# Patient Record
Sex: Female | Born: 1960 | Race: White | Hispanic: No | Smoking: Former smoker
Health system: Southern US, Community
[De-identification: ages and names within clinical notes are randomized; demographics above are authoritative.]

## PROBLEM LIST (undated history)

## (undated) DIAGNOSIS — F419 Anxiety disorder, unspecified: Secondary | ICD-10-CM

## (undated) DIAGNOSIS — R112 Nausea with vomiting, unspecified: Secondary | ICD-10-CM

## (undated) DIAGNOSIS — Z9889 Other specified postprocedural states: Secondary | ICD-10-CM

## (undated) DIAGNOSIS — F329 Major depressive disorder, single episode, unspecified: Secondary | ICD-10-CM

## (undated) DIAGNOSIS — F32A Depression, unspecified: Secondary | ICD-10-CM

## (undated) HISTORY — PX: OTHER SURGICAL HISTORY: SHX169

---

## 1997-12-17 ENCOUNTER — Encounter: Admission: RE | Admit: 1997-12-17 | Discharge: 1997-12-17 | Payer: Self-pay | Admitting: *Deleted

## 1999-12-14 ENCOUNTER — Other Ambulatory Visit: Admission: RE | Admit: 1999-12-14 | Discharge: 1999-12-14 | Payer: Self-pay | Admitting: Obstetrics and Gynecology

## 2001-01-22 ENCOUNTER — Other Ambulatory Visit: Admission: RE | Admit: 2001-01-22 | Discharge: 2001-01-22 | Payer: Self-pay | Admitting: Obstetrics and Gynecology

## 2002-03-25 ENCOUNTER — Other Ambulatory Visit: Admission: RE | Admit: 2002-03-25 | Discharge: 2002-03-25 | Payer: Self-pay | Admitting: Obstetrics and Gynecology

## 2003-06-24 ENCOUNTER — Other Ambulatory Visit: Admission: RE | Admit: 2003-06-24 | Discharge: 2003-06-24 | Payer: Self-pay | Admitting: Obstetrics and Gynecology

## 2004-06-22 ENCOUNTER — Other Ambulatory Visit: Admission: RE | Admit: 2004-06-22 | Discharge: 2004-06-22 | Payer: Self-pay | Admitting: Obstetrics and Gynecology

## 2004-09-06 ENCOUNTER — Ambulatory Visit (HOSPITAL_COMMUNITY): Admission: RE | Admit: 2004-09-06 | Discharge: 2004-09-06 | Payer: Self-pay | Admitting: Obstetrics and Gynecology

## 2004-09-06 ENCOUNTER — Encounter (INDEPENDENT_AMBULATORY_CARE_PROVIDER_SITE_OTHER): Payer: Self-pay | Admitting: Specialist

## 2006-06-24 ENCOUNTER — Inpatient Hospital Stay (HOSPITAL_COMMUNITY): Admission: RE | Admit: 2006-06-24 | Discharge: 2006-06-25 | Payer: Self-pay | Admitting: Psychiatry

## 2006-06-24 ENCOUNTER — Ambulatory Visit: Payer: Self-pay | Admitting: Psychiatry

## 2008-11-22 ENCOUNTER — Other Ambulatory Visit: Admission: RE | Admit: 2008-11-22 | Discharge: 2008-11-22 | Payer: Self-pay | Admitting: Family Medicine

## 2010-04-10 ENCOUNTER — Emergency Department (HOSPITAL_COMMUNITY)
Admission: EM | Admit: 2010-04-10 | Discharge: 2010-04-10 | Disposition: A | Discharge status: Home / Self Care | Payer: 59 | Attending: Emergency Medicine | Admitting: Emergency Medicine

## 2010-04-10 DIAGNOSIS — T424X4A Poisoning by benzodiazepines, undetermined, initial encounter: Secondary | ICD-10-CM | POA: Insufficient documentation

## 2010-04-10 DIAGNOSIS — Y92009 Unspecified place in unspecified non-institutional (private) residence as the place of occurrence of the external cause: Secondary | ICD-10-CM | POA: Insufficient documentation

## 2010-04-10 DIAGNOSIS — F3289 Other specified depressive episodes: Secondary | ICD-10-CM | POA: Insufficient documentation

## 2010-04-10 DIAGNOSIS — F329 Major depressive disorder, single episode, unspecified: Secondary | ICD-10-CM | POA: Insufficient documentation

## 2010-04-10 DIAGNOSIS — T43502A Poisoning by unspecified antipsychotics and neuroleptics, intentional self-harm, initial encounter: Secondary | ICD-10-CM | POA: Insufficient documentation

## 2010-04-10 LAB — COMPREHENSIVE METABOLIC PANEL
ALT: 14 U/L (ref 0–35)
AST: 16 U/L (ref 0–37)
Albumin: 3.6 g/dL (ref 3.5–5.2)
Alkaline Phosphatase: 44 U/L (ref 39–117)
Calcium: 8.8 mg/dL (ref 8.4–10.5)
GFR calc Af Amer: >60 mL/min (ref >60)
Glucose, Bld: 98 mg/dL (ref 70–99)
Potassium: 3.9 mEq/L (ref 3.5–5.1)
Sodium: 139 mEq/L (ref 135–145)
Total Protein: 5.9 g/dL — ABNORMAL LOW (ref 6.0–8.3)

## 2010-04-10 LAB — URINALYSIS, ROUTINE W REFLEX MICROSCOPIC
Ketones, ur: NEGATIVE mg/dL
Specific Gravity, Urine: 1.02 (ref 1.005–1.030)
Urine Glucose, Fasting: NEGATIVE mg/dL
pH: 5.5 (ref 5.0–8.0)

## 2010-04-10 LAB — RAPID URINE DRUG SCREEN, HOSP PERFORMED
Amphetamines: NOT DETECTED
Barbiturates: NOT DETECTED
Cocaine: NOT DETECTED
Opiates: NOT DETECTED
Tetrahydrocannabinol: NOT DETECTED

## 2010-04-10 LAB — CBC
Hemoglobin: 13 g/dL (ref 12.0–15.0)
MCH: 30.5 pg (ref 26.0–34.0)
RBC: 4.26 MIL/uL (ref 3.87–5.11)
WBC: 8.7 10*3/uL (ref 4.0–10.5)

## 2010-04-10 LAB — DIFFERENTIAL
Basophils Relative: 0 % (ref 0–1)
Lymphs Abs: 3.2 10*3/uL (ref 0.7–4.0)
Monocytes Relative: 8 % (ref 3–12)
Neutro Abs: 4.5 10*3/uL (ref 1.7–7.7)
Neutrophils Relative %: 52 % (ref 43–77)

## 2010-04-10 LAB — ACETAMINOPHEN LEVEL: Acetaminophen (Tylenol), Serum: <10 ug/mL — ABNORMAL LOW (ref 10–30)

## 2010-04-13 ENCOUNTER — Other Ambulatory Visit (HOSPITAL_COMMUNITY): Payer: 59 | Attending: Psychiatry | Admitting: Psychiatry

## 2010-04-13 DIAGNOSIS — F331 Major depressive disorder, recurrent, moderate: Secondary | ICD-10-CM

## 2010-04-13 DIAGNOSIS — F1110 Opioid abuse, uncomplicated: Secondary | ICD-10-CM | POA: Insufficient documentation

## 2010-04-13 DIAGNOSIS — Z6379 Other stressful life events affecting family and household: Secondary | ICD-10-CM | POA: Insufficient documentation

## 2010-04-13 DIAGNOSIS — F339 Major depressive disorder, recurrent, unspecified: Secondary | ICD-10-CM | POA: Insufficient documentation

## 2010-04-13 DIAGNOSIS — IMO0002 Reserved for concepts with insufficient information to code with codable children: Secondary | ICD-10-CM | POA: Insufficient documentation

## 2010-04-13 DIAGNOSIS — Z818 Family history of other mental and behavioral disorders: Secondary | ICD-10-CM | POA: Insufficient documentation

## 2010-04-13 DIAGNOSIS — F411 Generalized anxiety disorder: Secondary | ICD-10-CM

## 2010-04-14 ENCOUNTER — Other Ambulatory Visit (HOSPITAL_COMMUNITY): Payer: 59 | Attending: Psychiatry | Admitting: Psychiatry

## 2010-04-14 DIAGNOSIS — IMO0002 Reserved for concepts with insufficient information to code with codable children: Secondary | ICD-10-CM | POA: Insufficient documentation

## 2010-04-14 DIAGNOSIS — F339 Major depressive disorder, recurrent, unspecified: Secondary | ICD-10-CM | POA: Insufficient documentation

## 2010-04-14 DIAGNOSIS — F411 Generalized anxiety disorder: Secondary | ICD-10-CM | POA: Insufficient documentation

## 2010-04-14 DIAGNOSIS — Z560 Unemployment, unspecified: Secondary | ICD-10-CM | POA: Insufficient documentation

## 2010-04-14 DIAGNOSIS — F1110 Opioid abuse, uncomplicated: Secondary | ICD-10-CM | POA: Insufficient documentation

## 2010-04-14 DIAGNOSIS — F17200 Nicotine dependence, unspecified, uncomplicated: Secondary | ICD-10-CM | POA: Insufficient documentation

## 2010-04-17 ENCOUNTER — Other Ambulatory Visit (HOSPITAL_COMMUNITY): Payer: 59 | Admitting: Psychiatry

## 2010-04-18 ENCOUNTER — Other Ambulatory Visit (HOSPITAL_COMMUNITY): Payer: 59 | Admitting: Psychiatry

## 2010-04-19 ENCOUNTER — Other Ambulatory Visit (HOSPITAL_COMMUNITY): Payer: 59 | Admitting: Psychiatry

## 2010-04-20 ENCOUNTER — Other Ambulatory Visit (HOSPITAL_COMMUNITY): Payer: 59 | Admitting: Psychiatry

## 2010-04-21 ENCOUNTER — Other Ambulatory Visit (HOSPITAL_COMMUNITY): Payer: 59 | Admitting: Psychiatry

## 2010-04-24 ENCOUNTER — Other Ambulatory Visit (HOSPITAL_COMMUNITY): Payer: 59 | Admitting: Psychiatry

## 2010-04-25 ENCOUNTER — Other Ambulatory Visit (HOSPITAL_COMMUNITY): Payer: 59 | Admitting: Psychiatry

## 2010-04-26 ENCOUNTER — Other Ambulatory Visit (HOSPITAL_COMMUNITY): Payer: 59 | Admitting: Psychiatry

## 2010-04-27 ENCOUNTER — Other Ambulatory Visit (HOSPITAL_COMMUNITY): Payer: 59 | Admitting: Psychiatry

## 2010-04-28 ENCOUNTER — Other Ambulatory Visit (HOSPITAL_COMMUNITY): Payer: 59 | Admitting: Psychiatry

## 2010-05-01 ENCOUNTER — Other Ambulatory Visit (HOSPITAL_COMMUNITY): Payer: 59 | Admitting: Psychiatry

## 2010-05-02 ENCOUNTER — Other Ambulatory Visit (HOSPITAL_COMMUNITY): Payer: 59 | Admitting: Psychiatry

## 2010-05-03 ENCOUNTER — Other Ambulatory Visit (HOSPITAL_COMMUNITY): Payer: 59 | Admitting: Psychiatry

## 2010-05-04 ENCOUNTER — Other Ambulatory Visit (HOSPITAL_COMMUNITY): Payer: 59 | Admitting: Psychiatry

## 2010-05-05 ENCOUNTER — Other Ambulatory Visit (HOSPITAL_COMMUNITY): Payer: 59 | Admitting: Psychiatry
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
## 2010-06-30 NOTE — H&P (Signed)
NAME:  Deanna Charles, Deanna Charles                 ACCOUNT NO.:  [id number]   MEDICAL RECORD NO.:  [id number]          PATIENT TYPE:  AMB   LOCATION:  SDC                           FACILITY:  [facility]   PHYSICIAN:  Charles A. Delcambre, MDDATE OF BIRTH:  Oct 20, 1960   DATE OF ADMISSION:  DATE OF DISCHARGE:                                HISTORY & PHYSICAL   CHIEF COMPLAINT:  Menorrhagia and endometrial polyp.   HISTORY OF PRESENT ILLNESS:  A 50 year old para 3-0-0-3 with abnormal  bleeding, heavy bleeding and known endometrial polyps on sonohysterogram,  multiple, failing Megace therapy and some small fibroids.  Endometrial  biopsy benign.   PAST MEDICAL HISTORY:  Anxiety and depression.   PAST SURGICAL HISTORY:  1.  Tubal ligation.  2.  SVD x3.   ALLERGIES:  No known drug allergies.   SOCIAL HISTORY:  One pack a day of cigarette smoking.  Occasional glass of  wine.  No drug use or STD exposure in the past.  Patient married, in  monogamous relationship with her husband.   FAMILY HISTORY:  Father with hypertension, Parkinson's disease, otherwise  negative family history by template.   REVIEW OF SYSTEMS:  Denies fever, chills, rashes, wheezing, headaches,  dizziness, seasonal allergies, chest pain, shortness of breath, diarrhea,  constipation, bleeding per rectum, urgency, frequency, dysuria,  galactorrhea, emotional changes.   PHYSICAL EXAMINATION:  GENERAL:  Alert and oriented x3.  No distress.  VITAL SIGNS:  Blood pressure 90/58, heart rate 72, weight 155 pounds,  respirations 18.  HEENT:  Grossly within normal limits.  NECK:  Supple without thyromegaly or adenopathy.  LUNGS:  Clear bilaterally.  HEART:  Regular rate and rhythm.  BREASTS:  Symmetrical, otherwise not examined.  ABDOMEN:  Soft, flat, and nontender.  No hepatosplenomegaly or other masses  noted.  PELVIC:  Normal external female genitalia.  Bartholin's, urethral, Skene's  within normal limits.  Vault without discharge or  lesions.  Multiparous  cervix noted.  Bimanual examination:  Uterus not significantly enlarged.  Uterus 8-10 weeks size.  I could not detect any significant enlargement  directly or irregularity.  Uterus was retroflexed.  Adnexa nontender without  masses bilaterally.  Ovaries palpably normal size bilaterally.   ASSESSMENT:  1.  Menorrhagia with prolonged irregular bleeding.  2.  Endometrial polyps.   PLAN:  Hysteroscopy, D&C.  Patient gives informed consent.  Accepts risk of  infection, bleeding, uterine perforation, blood product risks including  hepatitis and HIV exposure.  All questions were answered.  She understands  risks that this will not correct her bleeding or control her bleeding,  possibility of having to go on to ablation versus hysterectomy at another  presentation.  All questions were answered and she will remain n.p.o. past  midnight evening prior to surgery.  We will proceed on with preoperative  chem-20, CBC.       CAD/MEDQ  D:  08/29/2004  T:  08/29/2004  Job:  [id number]

## 2010-06-30 NOTE — Op Note (Signed)
NAME:  Deanna Charles, Deanna Charles                 ACCOUNT NO.:  [id number]   MEDICAL RECORD NO.:  [id number]          PATIENT TYPE:  AMB   LOCATION:  SDC                           [facility]ILITY[facility]   PHYSICIAN:  Charles A. Delcambre, MDDATE OF BIRTH:  January 30, 1961   DATE OF PROCEDURE:  09/06/2004  DATE OF DISCHARGE:                                 OPERATIVE REPORT   PREOPERATIVE DIAGNOSES:  1.  Menorrhagia.  2.  Endometrial polyps.   POSTOPERATIVE DIAGNOSES:  1.  Menorrhagia.  2.  Endometrial polyps.   PROCEDURES:  1.  Hysteroscopy.  2.  Dilation and curettage.  3.  Polypectomy.  4.  Paracervical block.   SURGEON:  Charles A. Sydnee Cabal, M.D.   ASSISTANT:  None.   COMPLICATIONS:  None.   OPERATIVE FINDINGS:  Endometrial polyps, three in the fundal area and one in  the lower uterine segment area.  Sound to 9 cm.  Uterine distention medium  loss sorbitol 20 mL.   SPECIMENS:  Endometrial polyps and endometrial curettings.   ESTIMATED BLOOD LOSS:  Less than or equal to 25 mL.   Sponge and needle count correct and instrument count correct.   DESCRIPTION OF PROCEDURE:  The patient was taken to the operating room and  placed in the dorsal lithotomy position and sterile prep and drape.  MAC  sedation was undertaken.  A weighted speculum was placed in the vagina.  A  single-tooth tenaculum was placed on the cervix.  A paracervical block of  0.25% plain Marcaine was placed, 20 mL divided equally at 4 and 8 o'clock  and no evidence of intravascular injection was noted.  Hanks dilators were  used to dilate enough to pass a 5 mm hysteroscope.  Hysteroscopy findings  were noted above.  Polyp forceps were used to remove the polyps directly.  There was no evidence of perforation during the procedure, and a small banjo  curette was used to curette the endometrial surface circumferentially.  No  complications were encountered.  No  evidence of perforation was encountered.  Hemostasis was adequate.  The  patient was awakened, all instruments were removed.  Hemostasis was adequate  at the tenaculum site and from the uterus upon visualization.  The patient  was taken to recovery room with physician in attendance, having tolerated  the procedure well.       CAD/MEDQ  D:  09/06/2004  T:  09/06/2004  Job:  [id number]

## 2011-03-29 ENCOUNTER — Emergency Department (HOSPITAL_COMMUNITY)
Admission: EM | Admit: 2011-03-29 | Discharge: 2011-03-29 | Disposition: A | Discharge status: Home / Self Care | Payer: 59 | Attending: Emergency Medicine | Admitting: Emergency Medicine

## 2011-03-29 ENCOUNTER — Encounter (HOSPITAL_COMMUNITY): Payer: Self-pay | Admitting: *Deleted

## 2011-03-29 DIAGNOSIS — S61209A Unspecified open wound of unspecified finger without damage to nail, initial encounter: Secondary | ICD-10-CM | POA: Insufficient documentation

## 2011-03-29 DIAGNOSIS — IMO0001 Reserved for inherently not codable concepts without codable children: Secondary | ICD-10-CM | POA: Insufficient documentation

## 2011-03-29 DIAGNOSIS — T148XXA Other injury of unspecified body region, initial encounter: Secondary | ICD-10-CM

## 2011-03-29 DIAGNOSIS — Z23 Encounter for immunization: Secondary | ICD-10-CM | POA: Insufficient documentation

## 2011-03-29 MED ORDER — RABIES VACCINE, PCEC IM SUSR
1.0000 mL | Freq: Once | INTRAMUSCULAR | Status: AC
  Administered 2011-03-29: 1 mL via INTRAMUSCULAR
  Filled 2011-03-29: qty 1

## 2011-03-29 MED ORDER — RABIES IMMUNE GLOBULIN 150 UNIT/ML IM INJ
20.0000 [IU]/kg | INJECTION | Freq: Once | INTRAMUSCULAR | Status: AC
  Administered 2011-03-29: 1725 [IU] via INTRAMUSCULAR
  Filled 2011-03-29: qty 11.5

## 2011-03-29 MED ORDER — RABIES IMMUNE GLOBULIN 150 UNIT/ML IM INJ
INJECTION | INTRAMUSCULAR | Status: AC
  Filled 2011-03-29: qty 2

## 2011-03-29 NOTE — Discharge Instructions (Signed)
Please come to the [facility] on day 3 -Feb 17, day 7 - Feb 21, day 14- Feb 28, and Day 28 -  March 14. For your  Rabies Vacine shots.Please finish your antibiotics for your hand as scheduled.Rabies Vaccine What You Need to Know WHAT IS RABIES?  Rabies is a serious disease. It is caused by a virus.   Rabies is mainly a disease of animals. Humans get rabies when they are bitten by infected animals.   At first there might not be any symptoms. Weeks, or even years after a bite, rabies can cause pain, fatigue, headaches, fever, and irritability. These are followed by seizures, hallucinations, and paralysis. Rabies is almost always fatal.   Wild animals, especially bats, are common source of human rabies infection. Skunks, raccoons, dogs, cats, coyotes, foxes and other mammals can also transmit the disease.   Human rabies is rare in the [place]. There have been only 55 cases diagnosed since 1990. However, between 16,000 and 39,000 people are vaccinated each year as a precaution after animal bites. Also, rabies is far more common in other parts of the world, with about 40,000 to 70,000 rabies-related deaths worldwide each year. Bites from unvaccinated dogs cause most of these cases.  Rabies vaccine can prevent rabies. RABIES VACCINE  Rabies vaccine is given to people at high risk of rabies to protect them if they are exposed. It can also prevent the disease if it is given to a person after they have been exposed.   Rabies vaccine is made from killed rabies virus. It cannot cause rabies.  WHO SHOULD GET RABIES VACCINE AND WHEN? Preventive Vaccination (No Exposure)  People at high risk of exposure to rabies, such as veterinarians, Educational psychologist, rabies laboratory workers, spelunkers, and rabies biologics production workers should be offered rabies vaccine.   The vaccine should also be considered for:   People whose activities bring them into frequent contact with rabies virus or with  possibly rabid animals.   International travelers who are likely to come in contact with animals in parts of the world where rabies is common.   The pre-exposure schedule for rabies vaccination is 3 doses, given at the following times:   Dose 1: As appropriate.   Dose 2: 7 days after Dose 1.   Dose 3: 21 days or 28 days after Dose 1.   For laboratory workers and others who may be repeatedly exposed to rabies virus, periodic testing for immunity is recommended. Booster doses should be given as needed. (Testing or booster doses are not recommended for travelers). Ask your caregiver for details.  Vaccination After an Exposure Anyone who has been bitten by an animal, or who otherwise may have been exposed to rabies, should see a caregiver immediately. The caregiver will determine if he or she needs to be vaccinated.  A person who is exposed and has never been vaccinated against rabies should get 4 doses of rabies vaccine.One dose right away and additional doses on the 3rd, 7th, and 14th days. They should also get another shot called RabiesImmuneGlobulin at the same time as the first dose.   A person who has been previously vaccinated should get 2 doses of rabies vaccine. One right away and another on the 3rd day. Rabies Immune Globulin is not needed.  TELL YOUR CAREGIVER IF: Talk with a caregiver before getting rabies vaccine if you:  Ever had a serious (life-threatening) allergic reaction to a previous dose of rabies vaccine or to any component  of the vaccine.   Have any severe allergies.   Have a weakened immune system because of:   HIV, AIDS, or another disease that affects the immune system.   Treatment with drugs that affect the immune system, such as steroids.   Cancer or cancer treatment with radiation or drugs.  If you have a minor illnesses, such as a cold, you can be vaccinated. If you are moderately or severely ill, wait until you recover before getting a routine  (non-exposure) dose of rabies vaccine. If you have been exposed to rabies virus, you should get the vaccine regardless of any other illnesses you may have. WHAT ARE THE RISKS FROM RABIES VACCINE? A vaccine, like any medicine, is capable of causing serious problems, such as severe allergic reactions. The risk of a vaccine causing serious harm, or death, is extremely small. Serious problems from rabies vaccine are very rare.  Mild problems:  Soreness, redness, swelling, or itching where the shot was given (30 % to 74 %).   Headache, nausea, abdominal pain, muscle aches, or dizziness (5% to 40 %).  Moderate problems:  Hives, pain in the joints, or fever (about 6 % of booster doses).   Other nervous system disorders, such as Guillain-Barr Syndrome (GBS), have been reported after rabies vaccine. This happens so rarely that it is not known whether they are related to the vaccine.  Note: Several brands of rabies vaccine are available in the [place], and reactions may vary between brands. Your caregiver can give you more information about a particular brand. WHAT IF THERE IS A MODERATE OR SEVERE REACTION? What should I look for? Any unusual condition, such as a severe allergic reaction or a high fever. If a severe allergic reaction occurred, it would be within a few minutes to an hour after the shot. Signs of a serious allergic reaction can include difficulty breathing, weakness, hoarseness or wheezing, a fast heartbeat, hives, dizziness, paleness, or swelling of the throat. What should I do?  Call your caregiver or get the person to a caregiver right away.   Tell the caregiver what happened, the date and time it happened, and when the vaccination was given.   Ask the caregiver, nurse, or health department to report the reaction by filing a Vaccine Adverse Event Reporting System (VAERS) form. Or, you can file this report through the VAERS website at www.vaers.[place].no or by calling  1-(903) 062-4559.  VAERS does not provide medical advice. HOW CAN I LEARN MORE?  Ask your caregiver or other health care provider. He or she can give you the vaccine package insert or suggest other sources of information.   Call your local or state health department.   Contact the Centers for Disease Control and Prevention (CDC):   Visit the CDC rabies website at [place]  CDC Rabies Vaccine VIS (11/18/07) Document Released: 11/26/2005 Document Revised: 10/17/2010 Document Reviewed: 05/06/2008 [facility] Patient Information 2012 [place], [place].

## 2011-03-29 NOTE — ED Notes (Signed)
Pt DC to home in the care of family 

## 2011-03-29 NOTE — ED Notes (Signed)
Told by PCP to come to ER for rabies vaccine.

## 2011-03-29 NOTE — ED Provider Notes (Signed)
History     CSN: [id number]  Arrival date & time 03/29/11  1907   None     Chief Complaint  Patient presents with  . Animal Bite    (Consider location/radiation/quality/duration/timing/severity/associated sxs/prior treatment) HPI Comments: Pt sustained a bite by a stray cat to the right index finger. Pt has been treated with antibiotic injections and oral antibiotics by Urgent Care. The cat could not be found for observation. Pt presents for rabies vaccine.  The history is provided by the patient.    History reviewed. No pertinent past medical history.  Past Surgical History  Procedure Date  . Cesarean section     History reviewed. No pertinent family history.  History  Substance Use Topics  . Smoking status: Never Smoker   . Smokeless tobacco: Not on file  . Alcohol Use: No    OB History    Grav Para Term Preterm Abortions TAB SAB Ect Mult Living                  Review of Systems  Constitutional: Negative for activity change.       All ROS Neg except as noted in HPI  HENT: Negative for nosebleeds and neck pain.   Eyes: Negative for photophobia and discharge.  Respiratory: Negative for cough, shortness of breath and wheezing.   Cardiovascular: Negative for chest pain and palpitations.  Gastrointestinal: Negative for abdominal pain and blood in stool.  Genitourinary: Negative for dysuria, frequency and hematuria.  Musculoskeletal: Negative for back pain and arthralgias.       Animal bite to the hand  Skin: Negative.   Neurological: Negative for dizziness, seizures and speech difficulty.  Psychiatric/Behavioral: Negative for hallucinations and confusion.    Allergies  Review of patient's allergies indicates no known allergies.  Home Medications   Current Outpatient Rx  Name Route Sig Dispense Refill  . CITALOPRAM HYDROBROMIDE 20 MG PO TABS Oral Take 20 mg by mouth daily.    Marland Kitchen CLONAZEPAM 0.5 MG PO TABS Oral Take 0.5 mg by mouth 4 (four) times daily as  needed. For anxiety      BP 102/67  Pulse 65  Temp(Src) 97.9 F (36.6 C) (Oral)  Resp 20  Ht 5\' 11"  (1.803 m)  Wt 189 lb (85.73 kg)  BMI 26.36 kg/m2  SpO2 100%  Physical Exam  Nursing note and vitals reviewed. Constitutional: She is oriented to person, place, and time. She appears well-developed and well-nourished.  Non-toxic appearance.  HENT:  Head: Normocephalic.  Right Ear: Tympanic membrane and external ear normal.  Left Ear: Tympanic membrane and external ear normal.  Eyes: EOM and lids are normal. Pupils are equal, round, and reactive to light.  Neck: Normal range of motion. Neck supple. Carotid bruit is not present.  Cardiovascular: Normal rate, regular rhythm, normal heart sounds, intact distal pulses and normal pulses.   Pulmonary/Chest: Breath sounds normal. No respiratory distress.  Abdominal: Soft. Bowel sounds are normal. There is no tenderness. There is no guarding.  Musculoskeletal: Normal range of motion.       There are healing puncture wounds of the palmar surface and the dorsum of the right second finger. There continues to be mild to moderate swelling of the DIP joint of the second finger. Sensory is intact. There is good capillary refill. There is no red streaking going up the hand or arm. The finger is not hot.  Lymphadenopathy:       Head (right side): No submandibular adenopathy present.  Head (left side): No submandibular adenopathy present.    She has no cervical adenopathy.  Neurological: She is alert and oriented to person, place, and time. She has normal strength. No cranial nerve deficit or sensory deficit.  Skin: Skin is warm and dry.  Psychiatric: She has a normal mood and affect. Her speech is normal.    ED Course  Procedures (including critical care time)  Labs Reviewed - No data to display No results found.   Dx: 1. Cat bite to the right index finger. 2. Possible rabies exposure.   MDM  I have reviewed nursing notes, vital signs,  and all appropriate lab and imaging results for this patient. Rabies vaccine and rabies immune globulin given given in the emergency department. Arrangements have been made for the patient to have the remainder of rabies vaccine at the specialty clinic.       Kathie Dike, [place] 03/30/11 [phone]

## 2011-03-29 NOTE — ED Notes (Signed)
Cat bite to rt index finger.

## 2011-03-31 NOTE — ED Provider Notes (Signed)
Medical screening examination/treatment/procedure(s) were performed by non-physician practitioner and as supervising physician I was immediately available for consultation/collaboration.  Nicoletta Dress. Colon Branch, MD 03/31/11 [phone]

## 2011-04-02 ENCOUNTER — Encounter (HOSPITAL_COMMUNITY): Payer: 59 | Attending: Emergency Medicine

## 2011-04-02 VITALS — BP 99/76 | HR 66 | Temp 97.3°F

## 2011-04-02 DIAGNOSIS — Z23 Encounter for immunization: Secondary | ICD-10-CM | POA: Insufficient documentation

## 2011-04-02 DIAGNOSIS — Z203 Contact with and (suspected) exposure to rabies: Secondary | ICD-10-CM | POA: Insufficient documentation

## 2011-04-02 MED ORDER — RABIES VACCINE, PCEC IM SUSR
1.0000 mL | Freq: Once | INTRAMUSCULAR | Status: AC
  Administered 2011-04-02: 1 mL via INTRAMUSCULAR
  Filled 2011-04-02: qty 1

## 2011-04-02 NOTE — Progress Notes (Signed)
Bennie Pierini presents today for injection per the provider's orders.  Rabavert administered IM in left Upper Arm.  Administration without incident.  Patient tolerated well and reports no questions or complaints at this time.

## 2011-04-06 ENCOUNTER — Ambulatory Visit (HOSPITAL_COMMUNITY): Payer: 59

## 2011-04-09 ENCOUNTER — Encounter (HOSPITAL_COMMUNITY): Payer: 59

## 2011-04-09 ENCOUNTER — Encounter (HOSPITAL_BASED_OUTPATIENT_CLINIC_OR_DEPARTMENT_OTHER): Payer: 59

## 2011-04-09 DIAGNOSIS — Z203 Contact with and (suspected) exposure to rabies: Secondary | ICD-10-CM

## 2011-04-09 DIAGNOSIS — Z23 Encounter for immunization: Secondary | ICD-10-CM

## 2011-04-09 MED ORDER — RABIES VACCINE, PCEC IM SUSR
1.0000 mL | Freq: Once | INTRAMUSCULAR | Status: AC
  Administered 2011-04-09: 1 mL via INTRAMUSCULAR
  Filled 2011-04-09: qty 1

## 2011-04-09 NOTE — Progress Notes (Signed)
Deanna Charles presents today for injection per the provider's orders.  Rabavert administered administration without incident; see MAR for injection details.  Patient tolerated procedure well and without incident.  No questions or complaints noted at this time.

## 2011-04-09 NOTE — Progress Notes (Signed)
Note previously written; see encounter when [facility] was [facility].

## 2011-04-13 ENCOUNTER — Ambulatory Visit (HOSPITAL_COMMUNITY): Payer: 59

## 2011-04-18 ENCOUNTER — Other Ambulatory Visit: Payer: Self-pay | Admitting: Family Medicine

## 2011-04-18 ENCOUNTER — Other Ambulatory Visit (HOSPITAL_COMMUNITY)
Admission: RE | Admit: 2011-04-18 | Discharge: 2011-04-18 | Disposition: A | Discharge status: Home / Self Care | Payer: 59 | Source: Ambulatory Visit | Attending: Family Medicine | Admitting: Family Medicine

## 2011-04-18 DIAGNOSIS — Z1159 Encounter for screening for other viral diseases: Secondary | ICD-10-CM | POA: Insufficient documentation

## 2011-04-18 DIAGNOSIS — Z124 Encounter for screening for malignant neoplasm of cervix: Secondary | ICD-10-CM | POA: Insufficient documentation

## 2011-08-20 ENCOUNTER — Emergency Department (HOSPITAL_BASED_OUTPATIENT_CLINIC_OR_DEPARTMENT_OTHER)
Admission: EM | Admit: 2011-08-20 | Discharge: 2011-08-20 | Disposition: A | Discharge status: Home / Self Care | Payer: 59 | Attending: Emergency Medicine | Admitting: Emergency Medicine

## 2011-08-20 ENCOUNTER — Emergency Department (HOSPITAL_BASED_OUTPATIENT_CLINIC_OR_DEPARTMENT_OTHER): Payer: 59

## 2011-08-20 ENCOUNTER — Encounter (HOSPITAL_BASED_OUTPATIENT_CLINIC_OR_DEPARTMENT_OTHER): Payer: Self-pay | Admitting: *Deleted

## 2011-08-20 DIAGNOSIS — R1110 Vomiting, unspecified: Secondary | ICD-10-CM | POA: Insufficient documentation

## 2011-08-20 DIAGNOSIS — W010XXA Fall on same level from slipping, tripping and stumbling without subsequent striking against object, initial encounter: Secondary | ICD-10-CM | POA: Insufficient documentation

## 2011-08-20 DIAGNOSIS — H53149 Visual discomfort, unspecified: Secondary | ICD-10-CM | POA: Insufficient documentation

## 2011-08-20 DIAGNOSIS — R5381 Other malaise: Secondary | ICD-10-CM | POA: Insufficient documentation

## 2011-08-20 DIAGNOSIS — R51 Headache: Secondary | ICD-10-CM | POA: Insufficient documentation

## 2011-08-20 DIAGNOSIS — Y92009 Unspecified place in unspecified non-institutional (private) residence as the place of occurrence of the external cause: Secondary | ICD-10-CM | POA: Insufficient documentation

## 2011-08-20 DIAGNOSIS — M542 Cervicalgia: Secondary | ICD-10-CM | POA: Insufficient documentation

## 2011-08-20 DIAGNOSIS — S0990XA Unspecified injury of head, initial encounter: Secondary | ICD-10-CM

## 2011-08-20 DIAGNOSIS — H538 Other visual disturbances: Secondary | ICD-10-CM | POA: Insufficient documentation

## 2011-08-20 MED ORDER — DIPHENHYDRAMINE HCL 50 MG/ML IJ SOLN
25.0000 mg | Freq: Once | INTRAMUSCULAR | Status: AC
  Administered 2011-08-20: 100 mg via INTRAMUSCULAR
  Filled 2011-08-20: qty 1

## 2011-08-20 MED ORDER — METOCLOPRAMIDE HCL 5 MG/ML IJ SOLN
10.0000 mg | Freq: Once | INTRAMUSCULAR | Status: AC
  Administered 2011-08-20: 10 mg via INTRAMUSCULAR
  Filled 2011-08-20: qty 2

## 2011-08-20 MED ORDER — DEXAMETHASONE SODIUM PHOSPHATE 10 MG/ML IJ SOLN
10.0000 mg | Freq: Once | INTRAMUSCULAR | Status: AC
  Administered 2011-08-20: 10 mg via INTRAMUSCULAR
  Filled 2011-08-20: qty 1

## 2011-08-20 NOTE — ED Notes (Signed)
Spoke with Dr Fredderick Phenix regarding patient and EDP assessed the patient and verbally ordered CT of head and neck.

## 2011-08-20 NOTE — ED Notes (Addendum)
Patient states that she fell Friday and hit the back of head. States that it was a fairly hard hit, experiencing some crossing of her eyes for a moment. Now states that the front of her head is hurting and this morning was having N/V and feels that she is more tired than usual.

## 2011-08-20 NOTE — ED Provider Notes (Signed)
History  This chart was scribed for Rolan Bucco, MD by Ladona Ridgel Day. This patient was seen in room MH02/MH02 and the patient's care was started at 1957.  CSN: [id number]  Arrival date & time 08/20/11  0865   First MD Initiated Contact with Patient 08/20/11 2152      Chief Complaint  Patient presents with  . Head Injury    Patient is a 51 y.o. female presenting with head injury. The history is provided by the patient. No language interpreter was used.  Head Injury  Associated symptoms include vomiting (Emesis episode this morning). Pertinent negatives include no numbness and no weakness.    Deanna Charles is a 51 y.o. female who presents to the Emergency Department complaining of an unchanged head injury 4 days ago after a slip and fall in her bathroom. Pt reports that she hit the back of her head on a cabinet. She denies LOC but reports 5 minutes of blurred vision that resolved on its own. She denies blurred vision currently. She describes her associated symptoms as frontally located HA that she rates as severe, photophobia, fatigue, stiff neck, and one episode of non-bloody emesis this AM. She reports taking Advil and Aleve with no improvement. She also denies any cough/congestion, SOB, chest pain, sensory changes, and abdominal pain.   History reviewed. No pertinent past medical history.  Past Surgical History  Procedure Date  . Cesarean section     No family history on file.  History  Substance Use Topics  . Smoking status: Never Smoker   . Smokeless tobacco: Not on file  . Alcohol Use: No    No OB history provided.  Review of Systems  Constitutional: Negative for fever, chills, diaphoresis and fatigue.  HENT: Positive for neck pain (Neck stiffness). Negative for congestion, rhinorrhea and sneezing.   Eyes: Negative.   Respiratory: Negative for cough, chest tightness and shortness of breath.   Cardiovascular: Negative for chest pain and leg swelling.  Gastrointestinal:  Positive for vomiting (Emesis episode this morning). Negative for nausea, abdominal pain, diarrhea and blood in stool.  Genitourinary: Negative for frequency, hematuria, flank pain and difficulty urinating.  Musculoskeletal: Negative for back pain and arthralgias.  Skin: Negative for rash.  Neurological: Negative for dizziness, speech difficulty, weakness, numbness and headaches.  Psychiatric/Behavioral: Negative for confusion.  All other systems reviewed and are negative.    Allergies  Review of patient's allergies indicates no known allergies.  Home Medications   Current Outpatient Rx  Name Route Sig Dispense Refill  . CITALOPRAM HYDROBROMIDE 20 MG PO TABS Oral Take 20 mg by mouth daily.    Marland Kitchen CLONAZEPAM 0.5 MG PO TABS Oral Take 0.5 mg by mouth 4 (four) times daily as needed. For anxiety      Triage Vitals:  BP 110/75  Pulse 66  Temp 98 F (36.7 C) (Oral)  Resp 20  SpO2 100%  Physical Exam  Nursing note and vitals reviewed. Constitutional: She is oriented to person, place, and time. She appears well-developed and well-nourished.  HENT:  Head: Normocephalic and atraumatic.  Eyes: Pupils are equal, round, and reactive to light.  Neck: Normal range of motion. Neck supple.       Minor tenderness of left lateral cervical spine  Cardiovascular: Normal rate, regular rhythm and normal heart sounds.   Pulmonary/Chest: Effort normal and breath sounds normal. No respiratory distress. She has no wheezes. She has no rales. She exhibits no tenderness.  Abdominal: Soft. Bowel sounds are normal. There is  no tenderness. There is no rebound and no guarding.  Musculoskeletal: Normal range of motion. She exhibits no edema and no tenderness.       No thoracic, lumbar or sacral tenderness  Lymphadenopathy:    She has no cervical adenopathy.  Neurological: She is alert and oriented to person, place, and time. She has normal strength. No cranial nerve deficit or sensory deficit. GCS eye subscore  is 4. GCS verbal subscore is 5. GCS motor subscore is 6.       Neuro intact. FTN intact  Skin: Skin is warm and dry. No rash noted.  Psychiatric: She has a normal mood and affect.    ED Course  Procedures (including critical care time)  DIAGNOSTIC STUDIES: Oxygen Saturation is 100% on room air, normal by my interpretation.    COORDINATION OF CARE: At 10:02 PM Discussed treatment plan with patient which includes pain/nausea medication and her negative head CT results. Patient agrees.    Labs Reviewed - No data to display Ct Head Wo Contrast  08/20/2011  *RADIOLOGY REPORT*  Clinical Data:  Fall, posterior head injury, headache  CT HEAD WITHOUT CONTRAST  Technique:  Contiguous axial images were obtained from the base of the skull through the vertex without contrast  Comparison:  None.  Findings:  The brain has a normal appearance without evidence for hemorrhage, acute infarction, hydrocephalus, or mass lesion.  There is no extra axial fluid collection.  The skull and paranasal sinuses are normal.  IMPRESSION: Normal CT of the head without contrast.  CT CERVICAL SPINE WITHOUT CONTRAST  Technique:  Multidetector CT imaging of the cervical spine was performed without intravenous contrast.  Multiplanar CT image reconstructions were also generated.  Comparison:   None.  Findings:  Straightened cervical spine alignment.  No compression fracture, wedge shaped deformity or focal kyphosis.  Facets aligned.  Intact odontoid.  Normal prevertebral soft tissues. Negative for fracture.  Foramina patent.  No significant degenerative disc disease.  IMPRESSION: No acute fracture or process abnormality by CT.  Original Report Authenticated By: Judie Petit. Ruel Favors, M.D.   Ct Cervical Spine Wo Contrast  08/20/2011  *RADIOLOGY REPORT*  Clinical Data:  Fall, posterior head injury, headache  CT HEAD WITHOUT CONTRAST  Technique:  Contiguous axial images were obtained from the base of the skull through the vertex without  contrast  Comparison:  None.  Findings:  The brain has a normal appearance without evidence for hemorrhage, acute infarction, hydrocephalus, or mass lesion.  There is no extra axial fluid collection.  The skull and paranasal sinuses are normal.  IMPRESSION: Normal CT of the head without contrast.  CT CERVICAL SPINE WITHOUT CONTRAST  Technique:  Multidetector CT imaging of the cervical spine was performed without intravenous contrast.  Multiplanar CT image reconstructions were also generated.  Comparison:   None.  Findings:  Straightened cervical spine alignment.  No compression fracture, wedge shaped deformity or focal kyphosis.  Facets aligned.  Intact odontoid.  Normal prevertebral soft tissues. Negative for fracture.  Foramina patent.  No significant degenerative disc disease.  IMPRESSION: No acute fracture or process abnormality by CT.  Original Report Authenticated By: Judie Petit. Ruel Favors, M.D.     1. Head injury       MDM  Will give meds for headache.  No signs of ICH/neck injury.  Advised to f/u with PMD if symptoms not improving I personally performed the services described in this documentation, which was scribed in my presence.  The recorded information has been  reviewed and considered.       Rolan Bucco, MD 08/20/11 2328

## 2012-07-19 DIAGNOSIS — M19019 Primary osteoarthritis, unspecified shoulder: Secondary | ICD-10-CM | POA: Insufficient documentation

## 2012-07-19 DIAGNOSIS — M25519 Pain in unspecified shoulder: Secondary | ICD-10-CM | POA: Insufficient documentation

## 2012-07-19 DIAGNOSIS — F329 Major depressive disorder, single episode, unspecified: Secondary | ICD-10-CM | POA: Insufficient documentation

## 2012-07-19 DIAGNOSIS — Z719 Counseling, unspecified: Secondary | ICD-10-CM | POA: Insufficient documentation

## 2012-10-28 ENCOUNTER — Encounter: Payer: Self-pay | Admitting: Cardiovascular Disease

## 2012-10-28 ENCOUNTER — Ambulatory Visit (INDEPENDENT_AMBULATORY_CARE_PROVIDER_SITE_OTHER): Payer: 59 | Admitting: Cardiovascular Disease

## 2012-10-28 VITALS — BP 118/78 | HR 57 | Resp 16 | Ht 70.0 in | Wt 199.3 lb

## 2012-10-28 DIAGNOSIS — R61 Generalized hyperhidrosis: Secondary | ICD-10-CM

## 2012-10-28 DIAGNOSIS — R0609 Other forms of dyspnea: Secondary | ICD-10-CM

## 2012-10-28 DIAGNOSIS — R0600 Dyspnea, unspecified: Secondary | ICD-10-CM

## 2012-10-28 DIAGNOSIS — R0789 Other chest pain: Secondary | ICD-10-CM

## 2012-10-28 DIAGNOSIS — R9431 Abnormal electrocardiogram [ECG] [EKG]: Secondary | ICD-10-CM

## 2012-10-28 DIAGNOSIS — R079 Chest pain, unspecified: Secondary | ICD-10-CM

## 2012-10-28 DIAGNOSIS — R0989 Other specified symptoms and signs involving the circulatory and respiratory systems: Secondary | ICD-10-CM
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
## 2012-10-28 NOTE — Patient Instructions (Addendum)
Your physician has requested that you have en exercise stress myoview. For further information please visit [email or web address] Please follow instruction sheet, as given.  Your physician recommends that you schedule a follow-up appointment in:  6 Weeks.

## 2012-10-28 NOTE — Assessment & Plan Note (Signed)
This is a nonspecific complaints and appears to be associated with prominent episodes of anxiety in certain social situations. Clearly her son's recent diagnosis with a psychotic disorder has created a lot of serious issues for her. She has not yet adjusted to this situation.

## 2012-10-28 NOTE — Assessment & Plan Note (Signed)
Most of her episodes of chest discomfort sound highly consistent with gastroesophageal reflux disease. If she burps she tastes acid in her mouth and has chest discomfort. Other episodes or not so typical, but did not appear to be exertional. Cannot exclude angina pectoris, but she definitely does not give a typical description for this. A stress test is indicated.

## 2012-10-28 NOTE — Assessment & Plan Note (Signed)
The ECG changes are nonspecific but most concerned about the fact that there are dynamic period T-wave inversion is seen in leads V1 through V3 suggestive of possible anterior wall ischemia on the ECG performed in urgent care, but these changes have resolved on today's tracing. This raises the concern for coronary insufficiency. Her symptoms are quite atypical for this. She has limited coronary risk factors(just postmenopausal state). Nevertheless because of the dynamic nature of the ECG changes I have recommended that she have a stress Myoview study. She also has an incomplete right bundle branch block and one wonders whether the T-wave changes might be a sign of right ventricular strain. She has gained 50 pounds of weight in the last couple of years. She is a loud snorer. Her husband has not witnessed apnea but has been concerned about her breathing during sleep. She wakes up feeling exhausted. She does not take daytime naps but has been known to fall asleep while waiting in traffic. If the stress test is normal I would follow up with a sleep study.

## 2012-10-28 NOTE — Assessment & Plan Note (Signed)
She has dyspnea with moderate activity it is substantially worse when compared to just 2 years ago. She blames it on weight gain. It may indeed be secondary to deconditioning but I think a stress test and possibly a sleep study would be beneficial to identify structural causes for this.

## 2012-10-29 ENCOUNTER — Encounter: Payer: Self-pay | Admitting: Cardiovascular Disease

## 2012-10-31 ENCOUNTER — Telehealth: Payer: Self-pay | Admitting: *Deleted

## 2012-10-31 DIAGNOSIS — R079 Chest pain, unspecified: Secondary | ICD-10-CM

## 2012-10-31 NOTE — Telephone Encounter (Signed)
Insurance will not pay for nuclear stress test.  Order changed to an exercise tolerance test.

## 2012-11-04 ENCOUNTER — Encounter (HOSPITAL_COMMUNITY): Payer: 59

## 2012-12-09 ENCOUNTER — Ambulatory Visit: Payer: 59 | Admitting: Cardiovascular Disease

## 2012-12-15 ENCOUNTER — Telehealth: Payer: Self-pay | Admitting: Cardiovascular Disease

## 2012-12-15 NOTE — Telephone Encounter (Signed)
Deanna Charles's exercise stress test is now authorized by [employer] from December 15, 2012 - January 29, 2013. Scheduler notified

## 2012-12-16 ENCOUNTER — Telehealth (HOSPITAL_COMMUNITY): Payer: Self-pay | Admitting: *Deleted

## 2012-12-22 ENCOUNTER — Telehealth: Payer: Self-pay | Admitting: *Deleted

## 2012-12-22 DIAGNOSIS — R9431 Abnormal electrocardiogram [ECG] [EKG]: Secondary | ICD-10-CM

## 2012-12-22 NOTE — Telephone Encounter (Signed)
ETT changed to an exercise Myoview.  Patient will call when she is ready to schedule.

## 2012-12-22 NOTE — Telephone Encounter (Signed)
Message copied by Vita Barley on Mon Dec 22, 2012  3:01 PM ------      Message from: Cleon Gustin D      Created: Thu Dec 18, 2012  2:54 PM      Regarding: Exercise Stress Test Order       Hi      Looks like Dr. Salena Saner ok'd for this patient to have an exercise stress test instead of a exc tolerance test. Can you please put in the order so that I can schedule it.            Thanks ------

## 2013-05-04 ENCOUNTER — Other Ambulatory Visit (HOSPITAL_COMMUNITY): Payer: Self-pay | Admitting: Family Medicine

## 2013-05-04 DIAGNOSIS — R0602 Shortness of breath: Secondary | ICD-10-CM

## 2013-05-12 ENCOUNTER — Inpatient Hospital Stay (HOSPITAL_COMMUNITY): Admission: RE | Admit: 2013-05-12 | Payer: 59 | Source: Ambulatory Visit

## 2014-07-24 ENCOUNTER — Emergency Department (HOSPITAL_BASED_OUTPATIENT_CLINIC_OR_DEPARTMENT_OTHER)

## 2014-07-24 ENCOUNTER — Emergency Department (HOSPITAL_BASED_OUTPATIENT_CLINIC_OR_DEPARTMENT_OTHER)
Admission: EM | Admit: 2014-07-24 | Discharge: 2014-07-24 | Disposition: A | Discharge status: Home / Self Care | Attending: Emergency Medicine | Admitting: Emergency Medicine

## 2014-07-24 ENCOUNTER — Encounter (HOSPITAL_BASED_OUTPATIENT_CLINIC_OR_DEPARTMENT_OTHER): Payer: Self-pay

## 2014-07-24 DIAGNOSIS — Y998 Other external cause status: Secondary | ICD-10-CM | POA: Insufficient documentation

## 2014-07-24 DIAGNOSIS — Z79899 Other long term (current) drug therapy: Secondary | ICD-10-CM | POA: Diagnosis not present

## 2014-07-24 DIAGNOSIS — Z87891 Personal history of nicotine dependence: Secondary | ICD-10-CM | POA: Diagnosis not present

## 2014-07-24 DIAGNOSIS — S62614A Displaced fracture of proximal phalanx of right ring finger, initial encounter for closed fracture: Secondary | ICD-10-CM | POA: Diagnosis not present

## 2014-07-24 DIAGNOSIS — S6991XA Unspecified injury of right wrist, hand and finger(s), initial encounter: Secondary | ICD-10-CM | POA: Diagnosis present

## 2014-07-24 DIAGNOSIS — S62609A Fracture of unspecified phalanx of unspecified finger, initial encounter for closed fracture: Secondary | ICD-10-CM

## 2014-07-24 DIAGNOSIS — Y92008 Other place in unspecified non-institutional (private) residence as the place of occurrence of the external cause: Secondary | ICD-10-CM | POA: Insufficient documentation

## 2014-07-24 DIAGNOSIS — Y9389 Activity, other specified: Secondary | ICD-10-CM | POA: Diagnosis not present

## 2014-07-24 DIAGNOSIS — T1490XA Injury, unspecified, initial encounter: Secondary | ICD-10-CM

## 2014-07-24 MED ORDER — IBUPROFEN 600 MG PO TABS: 600.0000 mg | ORAL_TABLET | Freq: Four times a day (QID) | ORAL | Status: DC | PRN

## 2014-07-24 MED ORDER — LIDOCAINE HCL (PF) 1 % IJ SOLN
30.0000 mL | Freq: Once | INTRAMUSCULAR | Status: AC
  Administered 2014-07-24: 30 mL
  Filled 2014-07-24: qty 30

## 2014-07-24 MED ORDER — HYDROCODONE-ACETAMINOPHEN 5-325 MG PO TABS: 1.0000 | ORAL_TABLET | ORAL | Status: DC | PRN

## 2014-07-24 NOTE — Discharge Instructions (Signed)
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
## 2014-08-18 ENCOUNTER — Other Ambulatory Visit: Payer: Self-pay | Admitting: Orthopedic Surgery

## 2014-08-19 ENCOUNTER — Encounter (HOSPITAL_BASED_OUTPATIENT_CLINIC_OR_DEPARTMENT_OTHER): Payer: Self-pay | Admitting: *Deleted

## 2014-08-20 ENCOUNTER — Ambulatory Visit (HOSPITAL_COMMUNITY): Payer: 59

## 2014-08-20 ENCOUNTER — Ambulatory Visit (HOSPITAL_BASED_OUTPATIENT_CLINIC_OR_DEPARTMENT_OTHER): Payer: 59 | Admitting: Anesthesiology

## 2014-08-20 ENCOUNTER — Encounter (HOSPITAL_BASED_OUTPATIENT_CLINIC_OR_DEPARTMENT_OTHER)
Admission: RE | Disposition: A | Discharge status: Home / Self Care | Payer: Self-pay | Source: Ambulatory Visit | Attending: Orthopedic Surgery

## 2014-08-20 ENCOUNTER — Encounter (HOSPITAL_BASED_OUTPATIENT_CLINIC_OR_DEPARTMENT_OTHER): Payer: Self-pay | Admitting: *Deleted

## 2014-08-20 ENCOUNTER — Ambulatory Visit (HOSPITAL_BASED_OUTPATIENT_CLINIC_OR_DEPARTMENT_OTHER)
Admission: RE | Admit: 2014-08-20 | Discharge: 2014-08-20 | Disposition: A | Discharge status: Home / Self Care | Payer: 59 | Source: Ambulatory Visit | Attending: Orthopedic Surgery | Admitting: Orthopedic Surgery

## 2014-08-20 DIAGNOSIS — Z87891 Personal history of nicotine dependence: Secondary | ICD-10-CM | POA: Insufficient documentation

## 2014-08-20 DIAGNOSIS — R9431 Abnormal electrocardiogram [ECG] [EKG]: Secondary | ICD-10-CM | POA: Diagnosis not present

## 2014-08-20 DIAGNOSIS — Y999 Unspecified external cause status: Secondary | ICD-10-CM | POA: Insufficient documentation

## 2014-08-20 DIAGNOSIS — F329 Major depressive disorder, single episode, unspecified: Secondary | ICD-10-CM | POA: Insufficient documentation

## 2014-08-20 DIAGNOSIS — F419 Anxiety disorder, unspecified: Secondary | ICD-10-CM | POA: Insufficient documentation

## 2014-08-20 DIAGNOSIS — Y939 Activity, unspecified: Secondary | ICD-10-CM | POA: Insufficient documentation

## 2014-08-20 DIAGNOSIS — Z79899 Other long term (current) drug therapy: Secondary | ICD-10-CM | POA: Diagnosis not present

## 2014-08-20 DIAGNOSIS — Z419 Encounter for procedure for purposes other than remedying health state, unspecified: Secondary | ICD-10-CM

## 2014-08-20 DIAGNOSIS — X58XXXA Exposure to other specified factors, initial encounter: Secondary | ICD-10-CM | POA: Diagnosis not present

## 2014-08-20 DIAGNOSIS — S62614A Displaced fracture of proximal phalanx of right ring finger, initial encounter for closed fracture: Secondary | ICD-10-CM | POA: Diagnosis present

## 2014-08-20 DIAGNOSIS — Y929 Unspecified place or not applicable: Secondary | ICD-10-CM | POA: Diagnosis not present

## 2014-08-20 DIAGNOSIS — I517 Cardiomegaly: Secondary | ICD-10-CM | POA: Diagnosis not present

## 2014-08-20 HISTORY — DX: Nausea with vomiting, unspecified: Z98.890

## 2014-08-20 HISTORY — DX: Depression, unspecified: F32.A

## 2014-08-20 HISTORY — PX: OPEN REDUCTION INTERNAL FIXATION (ORIF) PROXIMAL PHALANX: SHX6235

## 2014-08-20 HISTORY — DX: Major depressive disorder, single episode, unspecified: F32.9

## 2014-08-20 HISTORY — DX: Anxiety disorder, unspecified: F41.9

## 2014-08-20 HISTORY — DX: Nausea with vomiting, unspecified: R11.2

## 2014-08-20 LAB — POCT HEMOGLOBIN-HEMACUE: HEMOGLOBIN: 14 g/dL (ref 12.0–15.0)

## 2014-08-20 SURGERY — OPEN REDUCTION INTERNAL FIXATION (ORIF) PROXIMAL PHALANX
Anesthesia: General | Site: Finger | Laterality: Right

## 2014-08-20 MED ORDER — CEFAZOLIN SODIUM-DEXTROSE 2-3 GM-% IV SOLR
INTRAVENOUS | Status: DC | PRN
  Administered 2014-08-20: 2 g via INTRAVENOUS

## 2014-08-20 MED ORDER — LACTATED RINGERS IV SOLN
INTRAVENOUS | Status: DC
  Administered 2014-08-20 (×2): via INTRAVENOUS

## 2014-08-20 MED ORDER — OXYCODONE HCL 5 MG PO TABS
5.0000 mg | ORAL_TABLET | Freq: Once | ORAL | Status: DC | PRN
Start: 2014-08-20 — End: 2014-08-20

## 2014-08-20 MED ORDER — MIDAZOLAM HCL 2 MG/2ML IJ SOLN
1.0000 mg | INTRAMUSCULAR | Status: DC | PRN
  Administered 2014-08-20: 2 mg via INTRAVENOUS

## 2014-08-20 MED ORDER — OXYCODONE HCL 5 MG/5ML PO SOLN: 5.0000 mg | Freq: Once | ORAL | Status: DC | PRN

## 2014-08-20 MED ORDER — EPHEDRINE SULFATE 50 MG/ML IJ SOLN
INTRAMUSCULAR | Status: DC | PRN
  Administered 2014-08-20: 10 mg via INTRAVENOUS

## 2014-08-20 MED ORDER — SCOPOLAMINE 1 MG/3DAYS TD PT72
1.0000 | MEDICATED_PATCH | Freq: Once | TRANSDERMAL | Status: DC | PRN
Start: 2014-08-20 — End: 2014-08-20

## 2014-08-20 MED ORDER — MIDAZOLAM HCL 2 MG/2ML IJ SOLN
INTRAMUSCULAR | Status: AC
  Filled 2014-08-20: qty 2

## 2014-08-20 MED ORDER — FENTANYL CITRATE (PF) 100 MCG/2ML IJ SOLN
50.0000 ug | INTRAMUSCULAR | Status: AC | PRN
  Administered 2014-08-20: 25 ug via INTRAVENOUS
  Administered 2014-08-20: 100 ug via INTRAVENOUS
  Administered 2014-08-20 (×2): 25 ug via INTRAVENOUS

## 2014-08-20 MED ORDER — GLYCOPYRROLATE 0.2 MG/ML IJ SOLN: 0.2000 mg | Freq: Once | INTRAMUSCULAR | Status: DC | PRN

## 2014-08-20 MED ORDER — BUPIVACAINE-EPINEPHRINE 0.5% -1:200000 IJ SOLN
INTRAMUSCULAR | Status: DC | PRN
  Administered 2014-08-20: 9.5 mL

## 2014-08-20 MED ORDER — PROMETHAZINE HCL 25 MG/ML IJ SOLN: 6.2500 mg | INTRAMUSCULAR | Status: DC | PRN

## 2014-08-20 MED ORDER — PROPOFOL 10 MG/ML IV BOLUS
INTRAVENOUS | Status: DC | PRN
  Administered 2014-08-20: 20 mg via INTRAVENOUS
  Administered 2014-08-20: 150 mg via INTRAVENOUS

## 2014-08-20 MED ORDER — LIDOCAINE HCL (CARDIAC) 20 MG/ML IV SOLN
INTRAVENOUS | Status: DC | PRN
  Administered 2014-08-20: 40 mg via INTRAVENOUS

## 2014-08-20 MED ORDER — LACTATED RINGERS IV SOLN: INTRAVENOUS | Status: DC

## 2014-08-20 MED ORDER — HYDROMORPHONE HCL 1 MG/ML IJ SOLN
INTRAMUSCULAR | Status: AC
Start: 2014-08-20 — End: 2014-08-20
  Filled 2014-08-20: qty 1

## 2014-08-20 MED ORDER — BUPIVACAINE-EPINEPHRINE (PF) 0.5% -1:200000 IJ SOLN
INTRAMUSCULAR | Status: AC
  Filled 2014-08-20: qty 30

## 2014-08-20 MED ORDER — CEFAZOLIN SODIUM-DEXTROSE 2-3 GM-% IV SOLR
INTRAVENOUS | Status: AC
  Filled 2014-08-20: qty 50

## 2014-08-20 MED ORDER — FENTANYL CITRATE (PF) 100 MCG/2ML IJ SOLN
INTRAMUSCULAR | Status: AC
  Filled 2014-08-20: qty 6

## 2014-08-20 MED ORDER — DEXAMETHASONE SODIUM PHOSPHATE 10 MG/ML IJ SOLN
INTRAMUSCULAR | Status: DC | PRN
  Administered 2014-08-20: 10 mg via INTRAVENOUS

## 2014-08-20 MED ORDER — HYDROMORPHONE HCL 1 MG/ML IJ SOLN
0.2500 mg | INTRAMUSCULAR | Status: DC | PRN
  Administered 2014-08-20: 0.25 mg via INTRAVENOUS
  Administered 2014-08-20: 0.5 mg via INTRAVENOUS

## 2014-08-20 MED ORDER — KETOROLAC TROMETHAMINE 30 MG/ML IJ SOLN: 30.0000 mg | Freq: Once | INTRAMUSCULAR | Status: DC | PRN

## 2014-08-20 MED ORDER — CEFAZOLIN SODIUM-DEXTROSE 2-3 GM-% IV SOLR: 2.0000 g | INTRAVENOUS | Status: DC

## 2014-08-20 MED ORDER — ONDANSETRON HCL 4 MG/2ML IJ SOLN
INTRAMUSCULAR | Status: DC | PRN
  Administered 2014-08-20: 4 mg via INTRAVENOUS

## 2014-08-20 MED ORDER — IBUPROFEN 600 MG PO TABS
600.0000 mg | ORAL_TABLET | Freq: Four times a day (QID) | ORAL | Status: DC | PRN
Start: 2014-08-20 — End: 2017-09-11

## 2014-08-20 SURGICAL SUPPLY — 57 items
BANDAGE COBAN STERILE 2 (GAUZE/BANDAGES/DRESSINGS) IMPLANT
BIT DRILL 1.1 (BIT) ×1
BIT DRILL 1.1MM (BIT) ×1
BIT DRILL 60X20X1.1XQC TMX (BIT) ×1 IMPLANT
BIT DRL 60X20X1.1XQC TMX (BIT) ×1
BLADE MINI RND TIP GREEN BEAV (BLADE) IMPLANT
BLADE SURG 15 STRL LF DISP TIS (BLADE) ×1 IMPLANT
BLADE SURG 15 STRL SS (BLADE) ×2
BNDG COHESIVE 4X5 TAN STRL (GAUZE/BANDAGES/DRESSINGS) ×3 IMPLANT
BNDG ESMARK 4X9 LF (GAUZE/BANDAGES/DRESSINGS) ×3 IMPLANT
BNDG GAUZE ELAST 4 BULKY (GAUZE/BANDAGES/DRESSINGS) ×3 IMPLANT
CHLORAPREP W/TINT 26ML (MISCELLANEOUS) ×3 IMPLANT
COVER BACK TABLE 60X90IN (DRAPES) ×3 IMPLANT
COVER MAYO STAND STRL (DRAPES) ×3 IMPLANT
CUFF TOURNIQUET SINGLE 18IN (TOURNIQUET CUFF) IMPLANT
DRAPE C-ARM 42X72 X-RAY (DRAPES) ×3 IMPLANT
DRAPE EXTREMITY T 121X128X90 (DRAPE) ×3 IMPLANT
DRAPE SURG 17X23 STRL (DRAPES) ×3 IMPLANT
DRIVER BIT 1.5 (TRAUMA) ×6 IMPLANT
DRSG EMULSION OIL 3X3 NADH (GAUZE/BANDAGES/DRESSINGS) ×3 IMPLANT
GAUZE SPONGE 4X4 12PLY STRL (GAUZE/BANDAGES/DRESSINGS) ×3 IMPLANT
GLOVE BIO SURGEON STRL SZ7.5 (GLOVE) ×3 IMPLANT
GLOVE BIOGEL PI IND STRL 7.0 (GLOVE) ×1 IMPLANT
GLOVE BIOGEL PI IND STRL 8 (GLOVE) ×1 IMPLANT
GLOVE BIOGEL PI INDICATOR 7.0 (GLOVE) ×2
GLOVE BIOGEL PI INDICATOR 8 (GLOVE) ×2
GLOVE ECLIPSE 6.5 STRL STRAW (GLOVE) ×3 IMPLANT
GLOVE SURG SS PI 7.0 STRL IVOR (GLOVE) ×6 IMPLANT
GOWN STRL REUS W/ TWL LRG LVL3 (GOWN DISPOSABLE) ×2 IMPLANT
GOWN STRL REUS W/TWL LRG LVL3 (GOWN DISPOSABLE) ×4
GOWN STRL REUS W/TWL XL LVL3 (GOWN DISPOSABLE) ×3 IMPLANT
K-WIRE DBL TRONS .035X6 (WIRE) ×3
KWIRE DBL TRONS .035X6 (WIRE) ×1 IMPLANT
LOCK SCREW 1.5X9MM (Screw) ×6 IMPLANT
NEEDLE HYPO 25X1 1.5 SAFETY (NEEDLE) ×3 IMPLANT
NS IRRIG 1000ML POUR BTL (IV SOLUTION) ×3 IMPLANT
PACK BASIN DAY SURGERY FS (CUSTOM PROCEDURE TRAY) ×3 IMPLANT
PADDING CAST ABS 4INX4YD NS (CAST SUPPLIES) ×2
PADDING CAST ABS COTTON 4X4 ST (CAST SUPPLIES) ×1 IMPLANT
PLATE STRAIGHT LOCK 1.5 (Plate) ×3 IMPLANT
RUBBERBAND STERILE (MISCELLANEOUS) IMPLANT
SCREW L 1.5X14 (Screw) ×3 IMPLANT
SCREW LOCK 1.5X9MM (Screw) ×2 IMPLANT
SCREW LOCKING 1.5X10 (Screw) ×3 IMPLANT
SCREW LOCKING 1.5X11MM (Screw) ×6 IMPLANT
SCREW NL 1.5X12 (Screw) ×3 IMPLANT
SCREW NONIOC 1.5 10M (Screw) ×3 IMPLANT
STOCKINETTE 6  STRL (DRAPES) ×2
STOCKINETTE 6 STRL (DRAPES) ×1 IMPLANT
SUT VICRYL 4-0 PS2 18IN ABS (SUTURE) ×3 IMPLANT
SUT VICRYL RAPIDE 4-0 (SUTURE) IMPLANT
SUT VICRYL RAPIDE 4/0 PS 2 (SUTURE) IMPLANT
SYR BULB 3OZ (MISCELLANEOUS) ×3 IMPLANT
SYRINGE 10CC LL (SYRINGE) ×3 IMPLANT
TOWEL OR 17X24 6PK STRL BLUE (TOWEL DISPOSABLE) ×3 IMPLANT
TOWEL OR NON WOVEN STRL DISP B (DISPOSABLE) ×3 IMPLANT
UNDERPAD 30X30 (UNDERPADS AND DIAPERS) ×3 IMPLANT

## 2014-08-20 NOTE — Anesthesia Postprocedure Evaluation (Signed)
  Anesthesia Post-op Note  Patient: Bennie PieriniStacia Donaghy  Procedure(s) Performed: Procedure(s): OPEN TREATMENT RIGHT RING FINGER FRACTURE (Right)  Patient Location: PACU  Anesthesia Type:General  Level of Consciousness: awake, alert  and oriented  Airway and Oxygen Therapy: Patient Spontanous Breathing  Post-op Pain: mild  Post-op Assessment: Post-op Vital signs reviewed              Post-op Vital Signs: Reviewed  Last Vitals:  Filed Vitals:   08/20/14 1415  BP: 103/65  Pulse: 84  Temp: 36.5 C  Resp: 20    Complications: No apparent anesthesia complications

## 2014-08-20 NOTE — Op Note (Signed)
08/20/2014  11:15 AM  PATIENT:  Deanna Charles  54 y.o. female  PRE-OPERATIVE DIAGNOSIS:  Displaced sub-acute R RF P1 fx  POST-OPERATIVE DIAGNOSIS:  Same  PROCEDURE:  ORIF R RF P1 fx   SURGEON: Cliffton Astersavid A. Janee Mornhompson, MD  PHYSICIAN ASSISTANT: Danielle RankinKirsten Schrader, OPA-C  ANESTHESIA:  general  SPECIMENS:  None  DRAINS:   None  EBL:  less than 50 mL  PREOPERATIVE INDICATIONS:  Deanna Charles is a  54 y.o. female with a subacute 583+ week old displaced R RF P1 fx  The risks benefits and alternatives were discussed with the patient preoperatively including but not limited to the risks of infection, bleeding, nerve injury, cardiopulmonary complications, the need for revision surgery, among others, and the patient verbalized understanding and consented to proceed.  OPERATIVE IMPLANTS: Biomet hand fracture straight 1.5 mm plate and associated screws  OPERATIVE PROCEDURE:  After receiving prophylactic antibiotics, the patient was escorted to the operative theatre and placed in a supine position. General anesthesia was administered A surgical "time-out" was performed during which the planned procedure, proposed operative site, and the correct patient identity were compared to the operative consent and agreement confirmed by the circulating nurse according to current facility policy.  Following application of a tourniquet to the operative extremity, the exposed skin was prepped with Chloraprep and draped in the usual sterile fashion.  The limb was exsanguinated with an Esmarch bandage and the tourniquet inflated to approximately 100mmHg higher than systolic BP.  A curved incision was made creating a dorsal flap over the proximal phalanx that was radially based. Full-thickness flap was elevated. Extensor tendon was split down the midline and reflected. Periosteum was similarly reflected. The periosteum was quite thickened callus evident at the fracture site. The callus was removed, the fracture site clean in  this manner, irrigated, and reapproximated. It was secured with a clamp and a K wire. Clinical evaluation of the digit showed it to flex down to the touchdown point just next to the long finger and radiographically it looked good, so a straight plate was cut to fit and placed on the dorsal aspect of the proximal phalanx, with associated locking and nonlocking screws. Final images were obtained. Provisional K wire for fixation was removed. Clinical exam again revealed the ring finger to have a touchdown point nicely adjacent to the long finger with no significant appreciable angulation or malrotation. Wound is irrigated and the periosteum reapproximated with 4-0 running Vicryls, followed by similar repair the extensor tendon. Half percent Marcaine with epinephrine was instilled at the base of the digit as a digital block. Tourniquet was released, additional hemostasis obtained bipolar cautery and the skin was closed with 40 Vicryls Rapide interrupted horizontal mattress sutures. A bulky hand dressing was applied without plaster.  She was awakened and taken to the recovery room stable condition, breathing spontaneously  DISPOSITION: She'll be discharged home today with typical instructions, likely returning next week to hand therapy to have her splint dressing removed, a protective hand -based splint constructed, and initiation of motion exercises. She'll return to see me 10-15 days surgery for wound and splint check, at which time she should have new x-rays of the right ring finger out of splint.

## 2014-08-20 NOTE — Anesthesia Preprocedure Evaluation (Addendum)
Anesthesia Evaluation  Patient identified by MRN, date of birth, ID band Patient awake    Reviewed: Allergy & Precautions, NPO status , Patient's Chart, lab work & pertinent test results  History of Anesthesia Complications (+) PONV  Airway Mallampati: I  TM Distance: >3 FB Neck ROM: Full    Dental   Pulmonary former smoker,  breath sounds clear to auscultation        Cardiovascular Exercise Tolerance: Good METS: 7 - 9 Mets - angina- DOE negative cardio ROS  Rhythm:Regular Rate:Normal  Pt does yoga for approx 1 hr daily without CP or SOB. Spoke with Dr. Royann Shiversroitoru concerning previous missed myoview and he states myoview was scheduled for "dynamic ekg changes but very minimal symptoms were present". He states he would not delay current surgery as long as no new symptoms present.   Neuro/Psych Anxiety Depression negative neurological ROS     GI/Hepatic negative GI ROS, Neg liver ROS,   Endo/Other  negative endocrine ROS  Renal/GU negative Renal ROS     Musculoskeletal negative musculoskeletal ROS (+)   Abdominal   Peds  Hematology negative hematology ROS (+)   Anesthesia Other Findings   Reproductive/Obstetrics                           Anesthesia Physical Anesthesia Plan  ASA: II  Anesthesia Plan: General   Post-op Pain Management:    Induction: Intravenous  Airway Management Planned: LMA  Additional Equipment:   Intra-op Plan:   Post-operative Plan:   Informed Consent: I have reviewed the patients History and Physical, chart, labs and discussed the procedure including the risks, benefits and alternatives for the proposed anesthesia with the patient or authorized representative who has indicated his/her understanding and acceptance.     Plan Discussed with: CRNA  Anesthesia Plan Comments:         Anesthesia Quick Evaluation

## 2014-08-20 NOTE — Anesthesia Procedure Notes (Signed)
Procedure Name: LMA Insertion Date/Time: 08/20/2014 11:12 AM Performed by: Gar GibbonKEETON, Terrisa Curfman S Pre-anesthesia Checklist: Patient identified, Emergency Drugs available, Suction available and Patient being monitored Patient Re-evaluated:Patient Re-evaluated prior to inductionOxygen Delivery Method: Circle System Utilized Preoxygenation: Pre-oxygenation with 100% oxygen Intubation Type: IV induction Ventilation: Mask ventilation without difficulty LMA: LMA inserted LMA Size: 4.0 Number of attempts: 1 Airway Equipment and Method: Bite block Placement Confirmation: positive ETCO2 Tube secured with: Tape Dental Injury: Teeth and Oropharynx as per pre-operative assessment

## 2014-08-20 NOTE — Transfer of Care (Signed)
Immediate Anesthesia Transfer of Care Note  Patient: Deanna Charles  Procedure(s) Performed: Procedure(s): OPEN TREATMENT RIGHT RING FINGER FRACTURE (Right)  Patient Location: PACU  Anesthesia Type:General  Level of Consciousness: awake, sedated and patient cooperative  Airway & Oxygen Therapy: Patient Spontanous Breathing and Patient connected to face mask oxygen  Post-op Assessment: Report given to RN and Post -op Vital signs reviewed and stable  Post vital signs: Reviewed and stable  Last Vitals:  Filed Vitals:   08/20/14 0954  BP: 103/67  Pulse: 75  Temp: 36.6 C  Resp: 20    Complications: No apparent anesthesia complications

## 2014-08-20 NOTE — Discharge Instructions (Addendum)
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
## 2014-08-23 ENCOUNTER — Encounter (HOSPITAL_BASED_OUTPATIENT_CLINIC_OR_DEPARTMENT_OTHER): Payer: Self-pay | Admitting: Orthopedic Surgery

## 2014-09-06 ENCOUNTER — Encounter: Payer: Self-pay | Admitting: Psychology

## 2014-09-06 ENCOUNTER — Ambulatory Visit: Payer: 59 | Attending: Psychology | Admitting: Psychology

## 2014-09-06 DIAGNOSIS — R413 Other amnesia: Secondary | ICD-10-CM | POA: Diagnosis not present

## 2014-09-06 DIAGNOSIS — F332 Major depressive disorder, recurrent severe without psychotic features: Secondary | ICD-10-CM | POA: Insufficient documentation

## 2014-09-06 NOTE — Progress Notes (Signed)
[facility]  [address]   Telephone [phone] [address] Fax [phone] [place], [place] [address]  Initial Contact Note  Name:  Deanna Charles Date of Birth; Feb 05, 1961 MRN:  [id number] Date:  09/06/2014  Deanna Charles is an 54 y.o. female with a  history of depression who was referred for neuropsychological evaluation by her psychiatrist, Barnett Abu, MD, due to [name]'s "extreme forgetfulness".   A total of 5 hours was spent today reviewing medical records, interviewing (CPT [phone]) Bennie Pierini and administering and scoring neurocognitive tests (CPT [phone] & [phone]).  Preliminary Diagnostic Impression: Memory loss [R41.3] Major Depressive Disorder, recurrent, severe without psychotic features [F33.2]  There were no concerns expressed or behaviors displayed by Bennie Pierini that would require immediate attention.   A full report will follow once the planned testing has been completed. She will call back to make a her next appointment.   Gladstone Pih, Ph.D Licensed Psychologist 09/06/2014

## 2014-09-14 ENCOUNTER — Ambulatory Visit: Payer: 59 | Attending: Psychology | Admitting: Psychology

## 2014-09-14 DIAGNOSIS — F332 Major depressive disorder, recurrent severe without psychotic features: Secondary | ICD-10-CM | POA: Insufficient documentation

## 2014-09-14 DIAGNOSIS — R413 Other amnesia: Secondary | ICD-10-CM | POA: Diagnosis not present
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
## 2015-03-02 ENCOUNTER — Encounter (HOSPITAL_BASED_OUTPATIENT_CLINIC_OR_DEPARTMENT_OTHER): Payer: Self-pay

## 2015-03-02 ENCOUNTER — Emergency Department (HOSPITAL_BASED_OUTPATIENT_CLINIC_OR_DEPARTMENT_OTHER)
Admission: EM | Admit: 2015-03-02 | Discharge: 2015-03-02 | Disposition: A | Discharge status: Home / Self Care | Payer: Medicaid Other | Attending: Emergency Medicine | Admitting: Emergency Medicine

## 2015-03-02 ENCOUNTER — Emergency Department (HOSPITAL_BASED_OUTPATIENT_CLINIC_OR_DEPARTMENT_OTHER): Payer: Medicaid Other

## 2015-03-02 DIAGNOSIS — K279 Peptic ulcer, site unspecified, unspecified as acute or chronic, without hemorrhage or perforation: Secondary | ICD-10-CM

## 2015-03-02 DIAGNOSIS — Z87891 Personal history of nicotine dependence: Secondary | ICD-10-CM | POA: Diagnosis not present

## 2015-03-02 DIAGNOSIS — Z9889 Other specified postprocedural states: Secondary | ICD-10-CM | POA: Diagnosis not present

## 2015-03-02 DIAGNOSIS — Z79899 Other long term (current) drug therapy: Secondary | ICD-10-CM | POA: Diagnosis not present

## 2015-03-02 DIAGNOSIS — R8299 Other abnormal findings in urine: Secondary | ICD-10-CM | POA: Diagnosis not present

## 2015-03-02 DIAGNOSIS — R109 Unspecified abdominal pain: Secondary | ICD-10-CM

## 2015-03-02 DIAGNOSIS — F329 Major depressive disorder, single episode, unspecified: Secondary | ICD-10-CM | POA: Diagnosis not present

## 2015-03-02 DIAGNOSIS — K2970 Gastritis, unspecified, without bleeding: Secondary | ICD-10-CM | POA: Diagnosis not present

## 2015-03-02 DIAGNOSIS — F419 Anxiety disorder, unspecified: Secondary | ICD-10-CM | POA: Diagnosis not present

## 2015-03-02 DIAGNOSIS — R1013 Epigastric pain: Secondary | ICD-10-CM | POA: Diagnosis present

## 2015-03-02 LAB — COMPREHENSIVE METABOLIC PANEL
ALK PHOS: 66 U/L (ref 38–126)
ALT: 20 U/L (ref 14–54)
AST: 24 U/L (ref 15–41)
Albumin: 3.8 g/dL (ref 3.5–5.0)
Anion gap: 8 (ref 5–15)
BILIRUBIN TOTAL: 0.7 mg/dL (ref 0.3–1.2)
BUN: 14 mg/dL (ref 6–20)
CALCIUM: 8.7 mg/dL — AB (ref 8.9–10.3)
CHLORIDE: 104 mmol/L (ref 101–111)
CO2: 26 mmol/L (ref 22–32)
Creatinine, Ser: 1.14 mg/dL — ABNORMAL HIGH (ref 0.44–1.00)
GFR calc Af Amer: >60 mL/min (ref >60)
GFR, EST NON AFRICAN AMERICAN: 54 mL/min — AB (ref >60)
Glucose, Bld: 96 mg/dL (ref 65–99)
Potassium: 4.6 mmol/L (ref 3.5–5.1)
Sodium: 138 mmol/L (ref 135–145)
Total Protein: 6.5 g/dL (ref 6.5–8.1)

## 2015-03-02 LAB — URINALYSIS, ROUTINE W REFLEX MICROSCOPIC
Bilirubin Urine: NEGATIVE
Glucose, UA: NEGATIVE mg/dL
HGB URINE DIPSTICK: NEGATIVE
Ketones, ur: NEGATIVE mg/dL
Nitrite: NEGATIVE
Protein, ur: NEGATIVE mg/dL
Specific Gravity, Urine: 1.022 (ref 1.005–1.030)
pH: 7 (ref 5.0–8.0)

## 2015-03-02 LAB — CBC WITH DIFFERENTIAL/PLATELET
Basophils Absolute: 0 10*3/uL (ref 0.0–0.1)
Basophils Relative: 1 %
Eosinophils Absolute: 0.3 10*3/uL (ref 0.0–0.7)
Eosinophils Relative: 4 %
HEMATOCRIT: 37.7 % (ref 36.0–46.0)
Hemoglobin: 12.2 g/dL (ref 12.0–15.0)
LYMPHS ABS: 2.8 10*3/uL (ref 0.7–4.0)
LYMPHS PCT: 46 %
MCH: 29 pg (ref 26.0–34.0)
MCHC: 32.4 g/dL (ref 30.0–36.0)
MCV: 89.5 fL (ref 78.0–100.0)
Monocytes Absolute: 0.5 10*3/uL (ref 0.1–1.0)
Monocytes Relative: 9 %
NEUTROS PCT: 40 %
Neutro Abs: 2.4 10*3/uL (ref 1.7–7.7)
PLATELETS: 238 10*3/uL (ref 150–400)
RBC: 4.21 MIL/uL (ref 3.87–5.11)
RDW: 13.5 % (ref 11.5–15.5)
WBC: 6 10*3/uL (ref 4.0–10.5)

## 2015-03-02 LAB — URINE MICROSCOPIC-ADD ON

## 2015-03-02 LAB — PROTIME-INR
INR: 0.9 (ref 0.00–1.49)
PROTHROMBIN TIME: 12.4 s (ref 11.6–15.2)

## 2015-03-02 MED ORDER — SUCRALFATE 1 G PO TABS: 1.0000 g | ORAL_TABLET | Freq: Four times a day (QID) | ORAL | Status: DC

## 2015-03-02 MED ORDER — OMEPRAZOLE 20 MG PO CPDR: 20.0000 mg | DELAYED_RELEASE_CAPSULE | Freq: Two times a day (BID) | ORAL | Status: DC

## 2015-03-02 MED ORDER — ONDANSETRON 4 MG PO TBDP: 4.0000 mg | ORAL_TABLET | Freq: Three times a day (TID) | ORAL | Status: DC | PRN

## 2015-03-02 NOTE — ED Notes (Signed)
MD at bedside. 

## 2015-03-02 NOTE — ED Provider Notes (Signed)
CSN: [id number]     Arrival date & time 03/02/15  1414 History   First MD Initiated Contact with Patient 03/02/15 1430     Chief Complaint  Patient presents with  . Dark Urine       HPI  Patient presents for evaluation of dark urine.  She has a distant history of gallbladder disorder diagnosed at [facility] several years ago. She has intermittent episodes arrival quadrant pain. She states that since the middle of December she'll have intermittent episodes were urine will be dark, almost the color of tea. States she ate little because he is discomfort in her epigastrium and right upper quadrant and back with any by mouth intake.   Pale stools. States she has normal bowel movements each morning. She is showing today she frequently gets diarrhea afterwards.  Past Medical History  Diagnosis Date  . Depression   . Anxiety   . PONV (postoperative nausea and vomiting)    Past Surgical History  Procedure Laterality Date  . Cesarean section    . Surgery on both feet - removed bone    . Uterine ablation    . Open reduction internal fixation (orif) proximal phalanx Right 08/20/2014    Procedure: OPEN TREATMENT RIGHT RING FINGER FRACTURE;  Surgeon: Mack Hook, MD;  Location: [facility];  Service: Orthopedics;  Laterality: Right;   History reviewed. No pertinent family history. Social History  Substance Use Topics  . Smoking status: Former Smoker    Types: Cigarettes    Quit date: 02/12/2011  . Smokeless tobacco: None     Comment: Quit 4 years ago   . Alcohol Use: No   OB History    No data available     Review of Systems  Constitutional: Negative for fever, chills, diaphoresis, appetite change and fatigue.  HENT: Negative for mouth sores, sore throat and trouble swallowing.   Eyes: Negative for visual disturbance.  Respiratory: Negative for cough, chest tightness, shortness of breath and wheezing.   Cardiovascular: Negative for chest pain.   Gastrointestinal: Positive for nausea, vomiting, abdominal pain and diarrhea. Negative for abdominal distention.  Endocrine: Negative for polydipsia, polyphagia and polyuria.  Genitourinary: Negative for dysuria, frequency and hematuria.  Musculoskeletal: Negative for gait problem.  Skin: Negative for color change, pallor and rash.  Neurological: Negative for dizziness, syncope, light-headedness and headaches.  Hematological: Does not bruise/bleed easily.  Psychiatric/Behavioral: Negative for behavioral problems and confusion.      Allergies  Review of patient's allergies indicates no known allergies.  Home Medications   Prior to Admission medications   Medication Sig Start Date End Date Taking? Authorizing Provider  buPROPion (WELLBUTRIN XL) 150 MG 24 hr tablet Take 450 mg by mouth daily. 10/10/12  Yes Historical Provider, MD  clonazePAM (KLONOPIN) 0.5 MG tablet Take 0.5 mg by mouth 4 (four) times daily as needed. For anxiety   Yes Historical Provider, MD  hydrOXYzine (VISTARIL) 25 MG capsule Take 25 mg by mouth 4 (four) times daily. 10/10/12  Yes Historical Provider, MD  ibuprofen (ADVIL,MOTRIN) 600 MG tablet Take 1 tablet (600 mg total) by mouth every 6 (six) hours as needed for moderate pain. 08/20/14  Yes Mack Hook, MD  Nutritional Supplements (ESTROVEN PO) Take by mouth.   Yes Historical Provider, MD  PARoxetine (PAXIL) 30 MG tablet Take 60 mg by mouth daily.   Yes Historical Provider, MD   BP 110/94 mmHg  Pulse 80  Temp(Src) 98.3 F (36.8 C) (Oral)  Resp  16  Ht  (1.803 m)  Wt 190 lb (86.183 kg)  BMI 26.51 kg/m2  SpO2 100% Physical Exam  Constitutional: She is oriented to person, place, and time. She appears well-developed and well-nourished. No distress.  HENT:  Head: Normocephalic.  Eyes: Conjunctivae are normal. Pupils are equal, round, and reactive to light. No scleral icterus.  Conjunctiva not pale. No scleral icterus.  Neck: Normal range of motion. Neck  supple. No thyromegaly present.  Cardiovascular: Normal rate and regular rhythm.  Exam reveals no gallop and no friction rub.   No murmur heard. Pulmonary/Chest: Effort normal and breath sounds normal. No respiratory distress. She has no wheezes. She has no rales.  Abdominal: Soft. Bowel sounds are normal. She exhibits no distension. There is no tenderness. There is no rebound.  No tenderness. No mass with palpation upper quadrants of the abdomen.  Musculoskeletal: Normal range of motion.  Neurological: She is alert and oriented to person, place, and time.  Skin: Skin is warm and dry. No rash noted.  Psychiatric: She has a normal mood and affect. Her behavior is normal.    ED Course  Procedures (including critical care time) Labs Review Labs Reviewed  CBC WITH DIFFERENTIAL/PLATELET  COMPREHENSIVE METABOLIC PANEL  PROTIME-INR  URINALYSIS, ROUTINE W REFLEX MICROSCOPIC (NOT AT [facility])    Imaging Review No results found. I have personally reviewed and evaluated these images and lab results as part of my medical decision-making.   EKG Interpretation None      MDM   Final diagnoses:  AP (abdominal pain)    Plan blood and urine, regular quadrant ultrasound. We'll reassess. A symptomatic at this time.  15:50:  Urine appears normal. Normal enzymes. Ultrasound pending. The darkness in her urine is very likely dependent upon her hydration status. Await ultrasound.  Rolland Porter, MD 03/02/15 [phone]

## 2015-03-02 NOTE — Discharge Instructions (Signed)
Abdominal Pain, Adult Many things can cause abdominal pain. Usually, abdominal pain is not caused by a disease and will improve without treatment. It can often be observed and treated at home. Your health care provider will do a physical exam and possibly order blood tests and X-rays to help determine the seriousness of your pain. However, in many cases, more time must pass before a clear cause of the pain can be found. Before that point, your health care provider may not know if you need more testing or further treatment. HOME CARE INSTRUCTIONS Monitor your abdominal pain for any changes. The following actions may help to alleviate any discomfort you are experiencing:  Only take over-the-counter or prescription medicines as directed by your health care provider.  Do not take laxatives unless directed to do so by your health care provider.  Try a clear liquid diet (broth, tea, or water) as directed by your health care provider. Slowly move to a bland diet as tolerated. SEEK MEDICAL CARE IF:  You have unexplained abdominal pain.  You have abdominal pain associated with nausea or diarrhea.  You have pain when you urinate or have a bowel movement.  You experience abdominal pain that wakes you in the night.  You have abdominal pain that is worsened or improved by eating food.  You have abdominal pain that is worsened with eating fatty foods.  You have a fever. SEEK IMMEDIATE MEDICAL CARE IF:  Your pain does not go away within 2 hours.  You keep throwing up (vomiting).  Your pain is felt only in portions of the abdomen, such as the right side or the left lower portion of the abdomen.  You pass bloody or black tarry stools. MAKE SURE YOU:  Understand these instructions.  Will watch your condition.  Will get help right away if you are not doing well or get worse.   This information is not intended to replace advice given to you by your health care provider. Make sure you discuss  any questions you have with your health care provider.   Document Released: 11/08/2004 Document Revised: 10/20/2014 Document Reviewed: 10/08/2012 Elsevier Interactive Patient Education 2016 [employer]  Gastritis, Adult Gastritis is soreness and puffiness (inflammation) of the lining of the stomach. If you do not get help, gastritis can cause bleeding and sores (ulcers) in the stomach. HOME CARE   Only take medicine as told by your doctor.  If you were given antibiotic medicines, take them as told. Finish the medicines even if you start to feel better.  Drink enough fluids to keep your pee (urine) clear or pale yellow.  Avoid foods and drinks that make your problems worse. Foods you may want to avoid include:  Caffeine or alcohol.  Chocolate.  Mint.  Garlic and onions.  Spicy foods.  Citrus fruits, including oranges, lemons, or limes.  Food containing tomatoes, including sauce, chili, salsa, and pizza.  Fried and fatty foods.  Eat small meals throughout the day instead of large meals. GET HELP RIGHT AWAY IF:   You have black or dark red poop (stools).  You throw up (vomit) blood. It may look like coffee grounds.  You cannot keep fluids down.  Your belly (abdominal) pain gets worse.  You have a fever.  You do not feel better after 1 week.  You have any other questions or concerns. MAKE SURE YOU:   Understand these instructions.  Will watch your condition.  Will get help right away if you are not doing  well or get worse.   This information is not intended to replace advice given to you by your health care provider. Make sure you discuss any questions you have with your health care provider.   Document Released: 07/18/2007 Document Revised: 04/23/2011 Document Reviewed: 03/14/2011 Elsevier Interactive Patient Education 2016 [employer].  Peptic Ulcer A peptic ulcer is a sore in the lining of your esophagus (esophageal ulcer), stomach (gastric ulcer),  or in the first part of your small intestine (duodenal ulcer). The ulcer causes erosion into the deeper tissue. CAUSES  Normally, the lining of the stomach and the small intestine protects itself from the acid that digests food. The protective lining can be damaged by:  An infection caused by a bacterium called Helicobacter pylori (H. pylori).  Regular use of nonsteroidal anti-inflammatory drugs (NSAIDs), such as ibuprofen or aspirin.  Smoking tobacco. Other risk factors include being older than 50, drinking alcohol excessively, and having a family history of ulcer disease.  SYMPTOMS   Burning pain or gnawing in the area between the chest and the belly button.  Heartburn.  Nausea and vomiting.  Bloating. The pain can be worse on an empty stomach and at night. If the ulcer results in bleeding, it can cause:  Black, tarry stools.  Vomiting of bright red blood.  Vomiting of coffee-ground-looking materials. DIAGNOSIS  A diagnosis is usually made based upon your history and an exam. Other tests and procedures may be performed to find the cause of the ulcer. Finding a cause will help determine the best treatment. Tests and procedures may include:  Blood tests, stool tests, or breath tests to check for the bacterium H. pylori.  An upper gastrointestinal (GI) series of the esophagus, stomach, and small intestine.  An endoscopy to examine the esophagus, stomach, and small intestine.  A biopsy. TREATMENT  Treatment may include:  Eliminating the cause of the ulcer, such as smoking, NSAIDs, or alcohol.  Medicines to reduce the amount of acid in your digestive tract.  Antibiotic medicines if the ulcer is caused by the H. pylori bacterium.  An upper endoscopy to treat a bleeding ulcer.  Surgery if the bleeding is severe or if the ulcer created a hole somewhere in the digestive system. HOME CARE INSTRUCTIONS   Avoid tobacco, alcohol, and caffeine. Smoking can increase the acid in  the stomach, and continued smoking will impair the healing of ulcers.  Avoid foods and drinks that seem to cause discomfort or aggravate your ulcer.  Only take medicines as directed by your caregiver. Do not substitute over-the-counter medicines for prescription medicines without talking to your caregiver.  Keep any follow-up appointments and tests as directed. SEEK MEDICAL CARE IF:   Your do not improve within 7 days of starting treatment.  You have ongoing indigestion or heartburn. SEEK IMMEDIATE MEDICAL CARE IF:   You have sudden, sharp, or persistent abdominal pain.  You have bloody or dark black, tarry stools.  You vomit blood or vomit that looks like coffee grounds.  You become light-headed, weak, or feel faint.  You become sweaty or clammy. MAKE SURE YOU:   Understand these instructions.  Will watch your condition.  Will get help right away if you are not doing well or get worse.   This information is not intended to replace advice given to you by your health care provider. Make sure you discuss any questions you have with your health care provider.   Document Released: 01/27/2000 Document Revised: 02/19/2014 Document Reviewed: 08/29/2011 Elsevier  Interactive Patient Education ©2016 Elsevier Inc. ° °

## 2015-03-02 NOTE — ED Notes (Signed)
Pt reports "iced tea colored urine since 12/25" - intermittent since then but worse today, RUQ pain with nausea currently - also reports associated intermittent indigestion, cold sweats, chest pain, lower back pain, bilateral shoulder pain, intermittent "stomach pain." Pt reports she thinks this is a flare of gallstones. Pt denies chest pain at this time.

## 2015-03-07 MED FILL — ONDANSETRON ODT 4 MG TABLET: 4 | 2 days supply | Qty: 6 | Fill #0

## 2015-03-07 MED FILL — OMEPRAZOLE DR 20 MG CAPSULE: 20 | 30 days supply | Qty: 60 | Fill #0

## 2015-03-07 MED FILL — SUCRALFATE 1 GM TABLET: 1 | 15 days supply | Qty: 60 | Fill #0

## 2015-06-03 ENCOUNTER — Emergency Department (HOSPITAL_BASED_OUTPATIENT_CLINIC_OR_DEPARTMENT_OTHER)

## 2015-06-03 ENCOUNTER — Encounter (HOSPITAL_BASED_OUTPATIENT_CLINIC_OR_DEPARTMENT_OTHER): Payer: Self-pay | Admitting: Emergency Medicine

## 2015-06-03 ENCOUNTER — Emergency Department (HOSPITAL_BASED_OUTPATIENT_CLINIC_OR_DEPARTMENT_OTHER)
Admission: EM | Admit: 2015-06-03 | Discharge: 2015-06-03 | Disposition: A | Discharge status: Home / Self Care | Attending: Emergency Medicine | Admitting: Emergency Medicine

## 2015-06-03 DIAGNOSIS — R0602 Shortness of breath: Secondary | ICD-10-CM | POA: Diagnosis not present

## 2015-06-03 DIAGNOSIS — M7989 Other specified soft tissue disorders: Secondary | ICD-10-CM | POA: Diagnosis not present

## 2015-06-03 DIAGNOSIS — R61 Generalized hyperhidrosis: Secondary | ICD-10-CM | POA: Diagnosis not present

## 2015-06-03 DIAGNOSIS — R079 Chest pain, unspecified: Secondary | ICD-10-CM | POA: Insufficient documentation

## 2015-06-03 DIAGNOSIS — F419 Anxiety disorder, unspecified: Secondary | ICD-10-CM | POA: Diagnosis not present

## 2015-06-03 DIAGNOSIS — Z87891 Personal history of nicotine dependence: Secondary | ICD-10-CM | POA: Insufficient documentation

## 2015-06-03 DIAGNOSIS — Z79899 Other long term (current) drug therapy: Secondary | ICD-10-CM | POA: Diagnosis not present

## 2015-06-03 DIAGNOSIS — F329 Major depressive disorder, single episode, unspecified: Secondary | ICD-10-CM | POA: Diagnosis not present

## 2015-06-03 LAB — D-DIMER, QUANTITATIVE: D-Dimer, Quant: 0.3 ug/mL-FEU (ref 0.00–0.50)

## 2015-06-03 LAB — BASIC METABOLIC PANEL
Anion gap: 7 (ref 5–15)
BUN: 20 mg/dL (ref 6–20)
CALCIUM: 8.9 mg/dL (ref 8.9–10.3)
CO2: 24 mmol/L (ref 22–32)
CREATININE: 0.95 mg/dL (ref 0.44–1.00)
Chloride: 108 mmol/L (ref 101–111)
GFR calc non Af Amer: >60 mL/min (ref >60)
Glucose, Bld: 95 mg/dL (ref 65–99)
Potassium: 3.7 mmol/L (ref 3.5–5.1)
SODIUM: 139 mmol/L (ref 135–145)

## 2015-06-03 LAB — CBC
HCT: 38.3 % (ref 36.0–46.0)
Hemoglobin: 13.2 g/dL (ref 12.0–15.0)
MCH: 30.8 pg (ref 26.0–34.0)
MCHC: 34.5 g/dL (ref 30.0–36.0)
MCV: 89.5 fL (ref 78.0–100.0)
PLATELETS: 243 10*3/uL (ref 150–400)
RBC: 4.28 MIL/uL (ref 3.87–5.11)
RDW: 13.7 % (ref 11.5–15.5)
WBC: 8.1 10*3/uL (ref 4.0–10.5)

## 2015-06-03 LAB — BRAIN NATRIURETIC PEPTIDE: B Natriuretic Peptide: 16.7 pg/mL (ref 0.0–100.0)

## 2015-06-03 LAB — TROPONIN I

## 2015-06-03 MED ORDER — ASPIRIN 81 MG PO CHEW
324.0000 mg | CHEWABLE_TABLET | Freq: Once | ORAL | Status: AC
  Administered 2015-06-03: 324 mg via ORAL
  Filled 2015-06-03: qty 4

## 2015-06-03 NOTE — ED Notes (Signed)
Patient reports that she had had on and off chest paina dn pressure x "weeks" the patient reports that she is now also now have SOB and anxiety with uncontrollable crying. Patient has 2 -3 plus pitting edema. Went to urgent care and was told that she had an abnormal EKG.

## 2015-06-03 NOTE — ED Provider Notes (Signed)
CSN: [id number]     Arrival date & time 06/03/15  1647 History   First MD Initiated Contact with Patient 06/03/15 1652     Chief Complaint  Patient presents with  . Chest Pain      HPI  Patient presents for evaluation of chest pain, shortness of breath, sweating, crying, and feet swelling.  She relates her symptoms at least for the last 1 month. Intermittent episodes of chest pain described as "pushing on the left side of my chest". She states she feels short of breath intermittently. Most predictably she states when she gets an discharge feels like she cannot breathe. This happened rather acutely this morning she states she almost a lumbar shower regularly on her bed. She states she started exercising about 1 month ago and walks daily. She does not have pain with ambulation. However, she has 3 flights of stairs to get her apartment. She walks her dog several times a day. States that she gets markedly short of breath with exerting on the stairs which is new over the last several months. She felt like she has swelling in both her feet this morning, however feels like it is "almost gone" now. Also states the last 3 days she's been sobbing and crying uncontrollably and does not go Y. Also states that intermittent she will become just drenched in sweat. She states she had her last period 10 years ago. History of uterine ablation but no hysterectomy with BSO. Not on hormones. However, started taking an over-the-counter "menopause supplement" a month ago.  Is a nonsmoker. + family history of heart disease.  Past Medical History  Diagnosis Date  . Depression   . Anxiety   . PONV (postoperative nausea and vomiting)    Past Surgical History  Procedure Laterality Date  . Cesarean section    . Surgery on both feet - removed bone    . Uterine ablation    . Open reduction internal fixation (orif) proximal phalanx Right 08/20/2014    Procedure: OPEN TREATMENT RIGHT RING FINGER FRACTURE;  Surgeon: Mack Hook, MD;  Location: [facility];  Service: Orthopedics;  Laterality: Right;   History reviewed. No pertinent family history. Social History  Substance Use Topics  . Smoking status: Former Smoker    Types: Cigarettes    Quit date: 02/12/2011  . Smokeless tobacco: None     Comment: Quit 4 years ago   . Alcohol Use: No   OB History    No data available     Review of Systems  Constitutional: Positive for diaphoresis. Negative for fever, chills, appetite change and fatigue.  HENT: Negative for mouth sores, sore throat and trouble swallowing.   Eyes: Negative for visual disturbance.  Respiratory: Positive for shortness of breath. Negative for cough, chest tightness and wheezing.   Cardiovascular: Positive for chest pain and leg swelling.  Gastrointestinal: Negative for nausea, vomiting, abdominal pain, diarrhea and abdominal distention.  Endocrine: Negative for polydipsia, polyphagia and polyuria.  Genitourinary: Negative for dysuria, frequency and hematuria.  Musculoskeletal: Negative for gait problem.  Skin: Negative for color change, pallor and rash.  Neurological: Negative for dizziness, syncope, light-headedness and headaches.  Hematological: Does not bruise/bleed easily.  Psychiatric/Behavioral: Negative for behavioral problems and confusion.       Emotionally labile      Allergies  Review of patient's allergies indicates no known allergies.  Home Medications   Prior to Admission medications   Medication Sig Start Date End Date Taking? Authorizing  Provider  QUEtiapine (SEROQUEL) 100 MG tablet Take 100 mg by mouth at bedtime.   Yes Historical Provider, MD  buPROPion (WELLBUTRIN XL) 150 MG 24 hr tablet Take 450 mg by mouth daily. 10/10/12   Historical Provider, MD  clonazePAM (KLONOPIN) 0.5 MG tablet Take 0.5 mg by mouth 4 (four) times daily as needed. For anxiety    Historical Provider, MD  hydrOXYzine (VISTARIL) 25 MG capsule Take 25 mg by mouth 4 (four)  times daily. 10/10/12   Historical Provider, MD  ibuprofen (ADVIL,MOTRIN) 600 MG tablet Take 1 tablet (600 mg total) by mouth every 6 (six) hours as needed for moderate pain. 08/20/14   Mack Hookavid Thompson, MD  Nutritional Supplements (ESTROVEN PO) Take by mouth.    Historical Provider, MD  omeprazole (PRILOSEC) 20 MG capsule Take 1 capsule (20 mg total) by mouth 2 (two) times daily. 03/02/15   Rolland PorterMark Hiroyuki Ozanich, MD  ondansetron (ZOFRAN ODT) 4 MG disintegrating tablet Take 1 tablet (4 mg total) by mouth every 8 (eight) hours as needed for nausea. 03/02/15   Rolland PorterMark Skanda Worlds, MD  PARoxetine (PAXIL) 30 MG tablet Take 60 mg by mouth daily.    Historical Provider, MD  sucralfate (CARAFATE) 1 g tablet Take 1 tablet (1 g total) by mouth 4 (four) times daily. 03/02/15   Rolland PorterMark Zareena Willis, MD   BP 127/77 mmHg  Pulse 70  Temp(Src) 98.5 F (36.9 C) (Oral)  Resp 16  SpO2 99% Physical Exam  Constitutional: She is oriented to person, place, and time. She appears well-developed and well-nourished. No distress.  Intermittently sobbing  HENT:  Head: Normocephalic.  Eyes: Conjunctivae are normal. Pupils are equal, round, and reactive to light. No scleral icterus.  Neck: Normal range of motion. Neck supple. No thyromegaly present.    Cardiovascular: Normal rate and regular rhythm.  Exam reveals no gallop and no friction rub.   No murmur heard. Pulmonary/Chest: Effort normal and breath sounds normal. No respiratory distress. She has no wheezes. She has no rales.  Clear bilateral breath sounds.  Abdominal: Soft. Bowel sounds are normal. She exhibits no distension. There is no tenderness. There is no rebound.  Musculoskeletal: Normal range of motion.  Neurological: She is alert and oriented to person, place, and time.  Skin: Skin is warm and dry. No rash noted.  1+ edema of the right lower extremity. No swelling noted to the left. Left right oximeter approximate 2 similar larger in circumference first left no cording or erythema. No  pain.  Psychiatric: She has a normal mood and affect. Her behavior is normal.    ED Course  Procedures (including critical care time) Labs Review Labs Reviewed  BASIC METABOLIC PANEL  CBC  TROPONIN I  BRAIN NATRIURETIC PEPTIDE  D-DIMER, QUANTITATIVE (NOT AT [facility])  TSH    Imaging Review Dg Chest 2 View  06/03/2015  CLINICAL DATA:  Chest pain and pressure for several weeks. Shortness of breath. Pitting edema. Abnormal EKG changes. EXAM: CHEST  2 VIEW COMPARISON:  08/14/2013 FINDINGS: The heart size and mediastinal contours are within normal limits. Both lungs are clear. The visualized skeletal structures are unremarkable. IMPRESSION: No active cardiopulmonary disease. Electronically Signed   By: Myles RosenthalJohn  Stahl M.D.   On: 06/03/2015 18:37   I have personally reviewed and evaluated these images and lab results as part of my medical decision-making.   EKG Interpretation   Date/Time:  Friday June 03 2015 16:57:33 EDT Ventricular Rate:  76 PR Interval:  179 QRS Duration: 95 QT Interval:  401 QTC Calculation: 451 R Axis:   -33 Text Interpretation:  Sinus rhythm Left axis deviation Low voltage,  extremity and precordial leads Consider anterior infarct Confirmed by  Fayrene Fearing  MD, Latifa Noble ([id number]) on 06/03/2015 5:19:44 PM      MDM   Final diagnoses:  Anxiety  Chest pain, unspecified chest pain type   Patient has inferior Q waves, and slow anterior R-wave progression. However changes are noted as far back as 2012. Has no pain currently. Does have some emotional lability. Normal d-dimer, TSH, BNP, and troponin. Has discussed her Klonopin use with her. She has a marked history of anxiety and depression in her chart. She states that she started taking her pulse only at night about 6 months ago because of somnolence. I've encouraged her to resume a morning dose, 12 hours apart from her nighttime dose to perhaps put a "ceiling" and some of her anxiety. She has never had a cardiac evaluation despite  an abnormal EKG. I think she is appropriate for outpatient follow-up with cardiology unless she really develops worsening pain or shortness of breath or progressive or worsening. She can always return to emergency with this.    Rolland Porter, MD 06/03/15 2004

## 2015-06-03 NOTE — Discharge Instructions (Signed)
Klonopin, take nightly, as well as a morning dose to take these 12 hours apart to try to curb some of your anxiety.  Call cardiology office above to make follow-up appointment. Return to ER with any new or worsening symptoms.

## 2015-06-04 LAB — TSH: TSH: 1.04 u[IU]/mL (ref 0.350–4.500)

## 2015-12-02 ENCOUNTER — Emergency Department (HOSPITAL_BASED_OUTPATIENT_CLINIC_OR_DEPARTMENT_OTHER): Payer: Medicaid Other

## 2015-12-02 ENCOUNTER — Emergency Department (HOSPITAL_BASED_OUTPATIENT_CLINIC_OR_DEPARTMENT_OTHER)
Admission: EM | Admit: 2015-12-02 | Discharge: 2015-12-02 | Disposition: A | Discharge status: Home / Self Care | Payer: Medicaid Other | Attending: Emergency Medicine | Admitting: Emergency Medicine

## 2015-12-02 ENCOUNTER — Other Ambulatory Visit: Payer: Self-pay

## 2015-12-02 ENCOUNTER — Encounter (HOSPITAL_BASED_OUTPATIENT_CLINIC_OR_DEPARTMENT_OTHER): Payer: Self-pay | Admitting: *Deleted

## 2015-12-02 DIAGNOSIS — R079 Chest pain, unspecified: Secondary | ICD-10-CM | POA: Insufficient documentation

## 2015-12-02 DIAGNOSIS — M791 Myalgia: Secondary | ICD-10-CM | POA: Diagnosis not present

## 2015-12-02 DIAGNOSIS — R0602 Shortness of breath: Secondary | ICD-10-CM | POA: Diagnosis present

## 2015-12-02 DIAGNOSIS — Z87891 Personal history of nicotine dependence: Secondary | ICD-10-CM | POA: Insufficient documentation

## 2015-12-02 DIAGNOSIS — R601 Generalized edema: Secondary | ICD-10-CM | POA: Insufficient documentation

## 2015-12-02 LAB — COMPREHENSIVE METABOLIC PANEL
ALBUMIN: 3.7 g/dL (ref 3.5–5.0)
ALT: 18 U/L (ref 14–54)
AST: 21 U/L (ref 15–41)
Alkaline Phosphatase: 44 U/L (ref 38–126)
Anion gap: 5 (ref 5–15)
BUN: 9 mg/dL (ref 6–20)
CHLORIDE: 107 mmol/L (ref 101–111)
CO2: 28 mmol/L (ref 22–32)
Calcium: 8.9 mg/dL (ref 8.9–10.3)
Creatinine, Ser: 0.91 mg/dL (ref 0.44–1.00)
GFR calc Af Amer: >60 mL/min (ref >60)
GFR calc non Af Amer: >60 mL/min (ref >60)
GLUCOSE: 103 mg/dL — AB (ref 65–99)
POTASSIUM: 4 mmol/L (ref 3.5–5.1)
SODIUM: 140 mmol/L (ref 135–145)
Total Bilirubin: 0.5 mg/dL (ref 0.3–1.2)
Total Protein: 6.2 g/dL — ABNORMAL LOW (ref 6.5–8.1)

## 2015-12-02 LAB — URINALYSIS, ROUTINE W REFLEX MICROSCOPIC
Bilirubin Urine: NEGATIVE
GLUCOSE, UA: NEGATIVE mg/dL
Hgb urine dipstick: NEGATIVE
Ketones, ur: NEGATIVE mg/dL
Nitrite: NEGATIVE
PH: 6.5 (ref 5.0–8.0)
PROTEIN: NEGATIVE mg/dL
Specific Gravity, Urine: 1.004 — ABNORMAL LOW (ref 1.005–1.030)

## 2015-12-02 LAB — URINE MICROSCOPIC-ADD ON: RBC / HPF: NONE SEEN RBC/hpf (ref 0–5)

## 2015-12-02 LAB — CBC WITH DIFFERENTIAL/PLATELET
BASOS ABS: 0 10*3/uL (ref 0.0–0.1)
BASOS PCT: 1 %
EOS ABS: 0.2 10*3/uL (ref 0.0–0.7)
EOS PCT: 2 %
HEMATOCRIT: 36.5 % (ref 36.0–46.0)
Hemoglobin: 12 g/dL (ref 12.0–15.0)
Lymphocytes Relative: 39 %
Lymphs Abs: 2.5 10*3/uL (ref 0.7–4.0)
MCH: 30.5 pg (ref 26.0–34.0)
MCHC: 32.9 g/dL (ref 30.0–36.0)
MCV: 92.6 fL (ref 78.0–100.0)
MONO ABS: 0.6 10*3/uL (ref 0.1–1.0)
Monocytes Relative: 9 %
NEUTROS ABS: 3.1 10*3/uL (ref 1.7–7.7)
Neutrophils Relative %: 49 %
PLATELETS: 225 10*3/uL (ref 150–400)
RBC: 3.94 MIL/uL (ref 3.87–5.11)
RDW: 13 % (ref 11.5–15.5)
WBC: 6.3 10*3/uL (ref 4.0–10.5)

## 2015-12-02 LAB — TROPONIN I: Troponin I: <0.03 ng/mL (ref <0.03)

## 2015-12-02 LAB — D-DIMER, QUANTITATIVE: D-Dimer, Quant: 0.4 ug/mL-FEU (ref 0.00–0.50)

## 2015-12-02 LAB — BRAIN NATRIURETIC PEPTIDE: B NATRIURETIC PEPTIDE 5: 52.7 pg/mL (ref 0.0–100.0)

## 2015-12-02 NOTE — ED Notes (Signed)
Pt. Has noted edema in the feet bilat. Non pitting edema.  More noted in the R than the L.  Pt. Has edema noted in her face and eyes also.

## 2015-12-02 NOTE — ED Triage Notes (Signed)
[facility] this am for SOB. At that time they found she had a bundle branch block on her EKG. She was told to come here for further evaluation.

## 2015-12-02 NOTE — ED Provider Notes (Signed)
[facility] Provider Note   CSN: [id number] Arrival date & time: 12/02/15  1403     History   Chief Complaint Chief Complaint  Patient presents with  . Shortness of Breath    HPI Deanna Charles is a 55 y.o. female.  HPI Patient presents with generalized swelling for the past few days. She states that the swelling in her hands has improved since this morning.No new exposures.She has some mild shortness of breath and left-sided chest pain. She states she initially thought this was due to moving. No recent extended travel. Chest pain is described as dull. No radiation. Past Medical History:  Diagnosis Date  . Anxiety   . Depression   . PONV (postoperative nausea and vomiting)     Patient Active Problem List   Diagnosis Date Noted  . Abnormal ECG 10/28/2012  . Diaphoresis 10/28/2012  . Chest discomfort 10/28/2012  . Dyspnea 10/28/2012    Past Surgical History:  Procedure Laterality Date  . CESAREAN SECTION    . OPEN REDUCTION INTERNAL FIXATION (ORIF) PROXIMAL PHALANX Right 08/20/2014   Procedure: OPEN TREATMENT RIGHT RING FINGER FRACTURE;  Surgeon: Mack Hookavid Thompson, MD;  Location: [facility];  Service: Orthopedics;  Laterality: Right;  . Surgery on both feet - removed bone    . Uterine ablation      OB History    No data available       Home Medications    Prior to Admission medications   Medication Sig Start Date End Date Taking? Authorizing Provider  buPROPion (WELLBUTRIN XL) 150 MG 24 hr tablet Take 50 mg by mouth daily.  06/11/15   Historical Provider, MD  clonazePAM (KLONOPIN) 0.5 MG tablet Take 1 mg by mouth 4 (four) times daily as needed. For anxiety  06/02/15   Historical Provider, MD  hydrOXYzine (VISTARIL) 25 MG capsule Take 25 mg by mouth 4 (four) times daily. 10/10/12   Historical Provider, MD  ibuprofen (ADVIL,MOTRIN) 600 MG tablet Take 1 tablet (600 mg total) by mouth every 6 (six) hours as needed for moderate pain. 08/20/14    Mack Hookavid Thompson, MD  Nutritional Supplements (ESTROVEN PO) Take by mouth.    Historical Provider, MD  omeprazole (PRILOSEC) 20 MG capsule Take 1 capsule (20 mg total) by mouth 2 (two) times daily. 03/02/15   Rolland PorterMark James, MD  ondansetron (ZOFRAN ODT) 4 MG disintegrating tablet Take 1 tablet (4 mg total) by mouth every 8 (eight) hours as needed for nausea. 03/02/15   Rolland PorterMark James, MD  PARoxetine (PAXIL) 30 MG tablet Take 40 mg by mouth daily.  06/02/15   Historical Provider, MD  QUEtiapine (SEROQUEL) 100 MG tablet Take 100 mg by mouth at bedtime.    Historical Provider, MD  sucralfate (CARAFATE) 1 g tablet Take 1 tablet (1 g total) by mouth 4 (four) times daily. 03/02/15   Rolland PorterMark James, MD    Family History No family history on file.  Social History Social History  Substance Use Topics  . Smoking status: Former Smoker    Types: Cigarettes    Quit date: 02/12/2011  . Smokeless tobacco: Never Used     Comment: Quit 4 years ago   . Alcohol use No     Allergies   Review of patient's allergies indicates no known allergies.   Review of Systems Review of Systems  Constitutional: Negative for chills and fever.  HENT: Positive for facial swelling. Negative for sore throat and trouble swallowing.   Respiratory: Positive for shortness  of breath. Negative for cough.   Cardiovascular: Positive for chest pain and leg swelling. Negative for palpitations.  Gastrointestinal: Negative for abdominal pain, constipation, diarrhea and nausea.  Genitourinary: Negative for dysuria, flank pain and frequency.  Musculoskeletal: Positive for back pain and myalgias. Negative for joint swelling, neck pain and neck stiffness.  Skin: Negative for rash and wound.  Neurological: Negative for dizziness, weakness, light-headedness, numbness and headaches.  All other systems reviewed and are negative.    Physical Exam Updated Vital Signs BP 95/77   Pulse 68   Temp 98.1 F (36.7 C) (Oral)   Resp 19   Ht 5\' 10"   (1.778 m)   Wt 191 lb (86.6 kg)   SpO2 98%   BMI 27.41 kg/m   Physical Exam  Constitutional: She is oriented to person, place, and time. She appears well-developed and well-nourished.  HENT:  Head: Normocephalic and atraumatic.  Mouth/Throat: Oropharynx is clear and moist.  Very mild periorbital edema.  Eyes: EOM are normal. Pupils are equal, round, and reactive to light.  Neck: Normal range of motion. Neck supple. No JVD present.  Cardiovascular: Normal rate, regular rhythm, normal heart sounds and intact distal pulses.  Exam reveals no gallop and no friction rub.   No murmur heard. Pulmonary/Chest: Effort normal and breath sounds normal. No respiratory distress. She has no wheezes. She has no rales. She exhibits no tenderness.  Abdominal: Soft. Bowel sounds are normal. There is no tenderness. There is no rebound and no guarding.  Musculoskeletal: Normal range of motion. She exhibits no edema or tenderness.  No perceptible extremity swelling.No calf tenderness. 2+ dorsalis pedis and posterior tibial pulses.  Neurological: She is alert and oriented to person, place, and time.  moves all extremities without deficit. Sensation intact.  Skin: Skin is warm and dry. Capillary refill takes less than 2 seconds. No rash noted. No erythema.  Psychiatric: She has a normal mood and affect. Her behavior is normal.  Nursing note and vitals reviewed.    ED Treatments / Results  Labs (all labs ordered are listed, but only abnormal results are displayed) Labs Reviewed  URINALYSIS, ROUTINE W REFLEX MICROSCOPIC (NOT AT [facility]) - Abnormal; Notable for the following:       Result Value   Specific Gravity, Urine 1.004 (*)    Leukocytes, UA SMALL (*)    All other components within normal limits  URINE MICROSCOPIC-ADD ON - Abnormal; Notable for the following:    Squamous Epithelial / LPF 0-5 (*)    Bacteria, UA RARE (*)    All other components within normal limits  COMPREHENSIVE METABOLIC PANEL -  Abnormal; Notable for the following:    Glucose, Bld 103 (*)    Total Protein 6.2 (*)    All other components within normal limits  CBC WITH DIFFERENTIAL/PLATELET  TROPONIN I  D-DIMER, QUANTITATIVE (NOT AT [facility])  BRAIN NATRIURETIC PEPTIDE    EKG  EKG Interpretation  Date/Time:  Friday December 02 2015 14:18:55 EDT Ventricular Rate:  69 PR Interval:  164 QRS Duration: 72 QT Interval:  382 QTC Calculation: 409 R Axis:   -11 Text Interpretation:  Normal sinus rhythm Possible Left atrial enlargement Low voltage QRS Inferior infarct , age undetermined Cannot rule out Anterior infarct , age undetermined Abnormal ECG Confirmed by Ranae Palms  MD, Karthikeya Funke ([id number]) on 12/02/2015 3:40:13 PM Also confirmed by Ranae Palms  MD, Fritzie Prioleau ([id number])  on 12/02/2015 3:52:08 PM       Radiology Dg Chest 2 View  Result  Date: 12/02/2015 CLINICAL DATA:  Left anterior chest pain and shortness of breath. Recent weight gain. Initial encounter. EXAM: CHEST  2 VIEW COMPARISON:  Chest radiograph performed 06/03/2015 FINDINGS: The lungs are well-aerated. Pulmonary vascularity is at the upper limits of normal. There is no evidence of focal opacification, pleural effusion or pneumothorax. The heart is normal in size; the mediastinal contour is within normal limits. No acute osseous abnormalities are seen. IMPRESSION: No acute cardiopulmonary process seen. Electronically Signed   By: Roanna Raider M.D.   On: 12/02/2015 16:34    Procedures Procedures (including critical care time)  Medications Ordered in ED Medications - No data to display   Initial Impression / Assessment and Plan / ED Course  I have reviewed the triage vital signs and the nursing notes.  Pertinent labs & imaging results that were available during my care of the patient were reviewed by me and considered in my medical decision making (see chart for details).  Clinical Course   Advised to decrease salt intake. Normal d-dimer. Vital signs remained  stable. Return precautions given.   Final Clinical Impressions(s) / ED Diagnoses   Final diagnoses:  Generalized edema    New Prescriptions Discharge Medication List as of 12/02/2015  5:19 PM       Loren Racer, MD 12/02/15 2353

## 2016-03-05 DIAGNOSIS — F419 Anxiety disorder, unspecified: Secondary | ICD-10-CM | POA: Insufficient documentation

## 2016-06-01 IMAGING — RF DG FINGER RING 2+V*R*
1 series · 3 of 3 positions shown · non-contrast
Comparison: 07/24/2014

CLINICAL DATA: ORIF of ring finger fracture.  Initial encounter.

EXAM:
RIGHT RING FINGER 2+V; DG C-ARM 61-120 MIN

[Series 1: run · 3 of 3 slices shown]
[im 1/3]
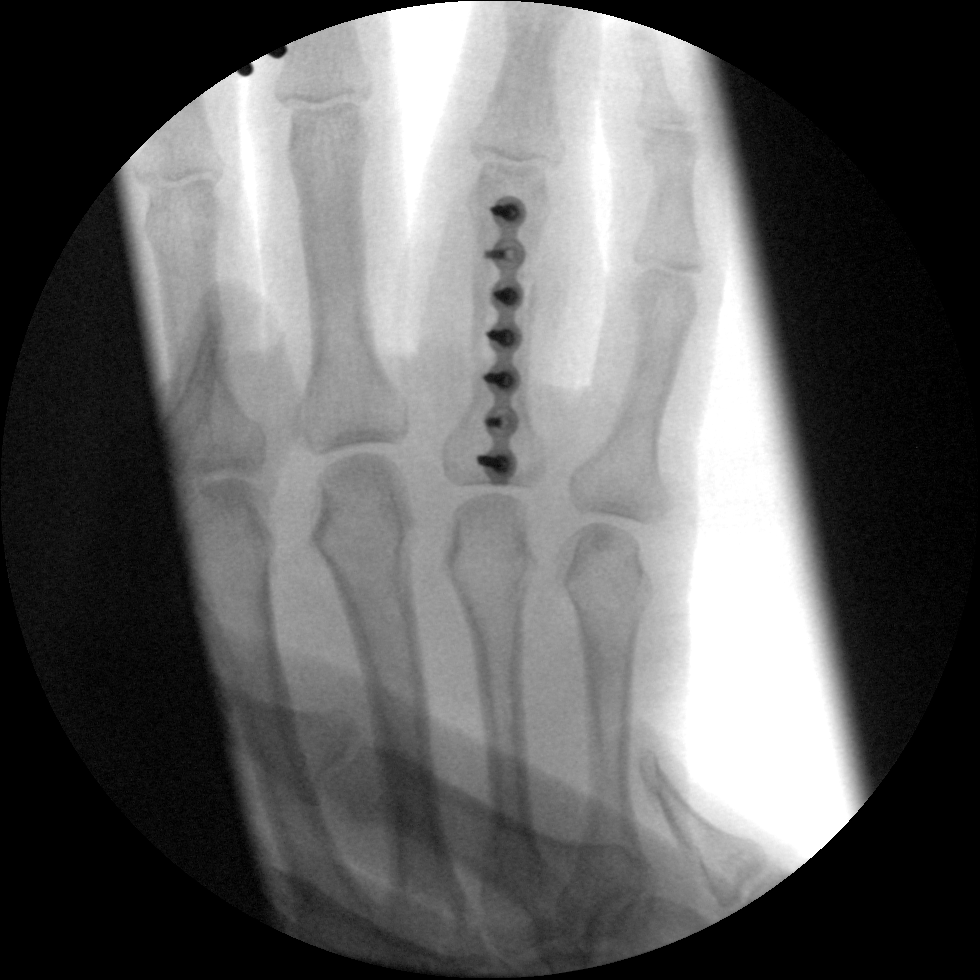
[im 2/3]
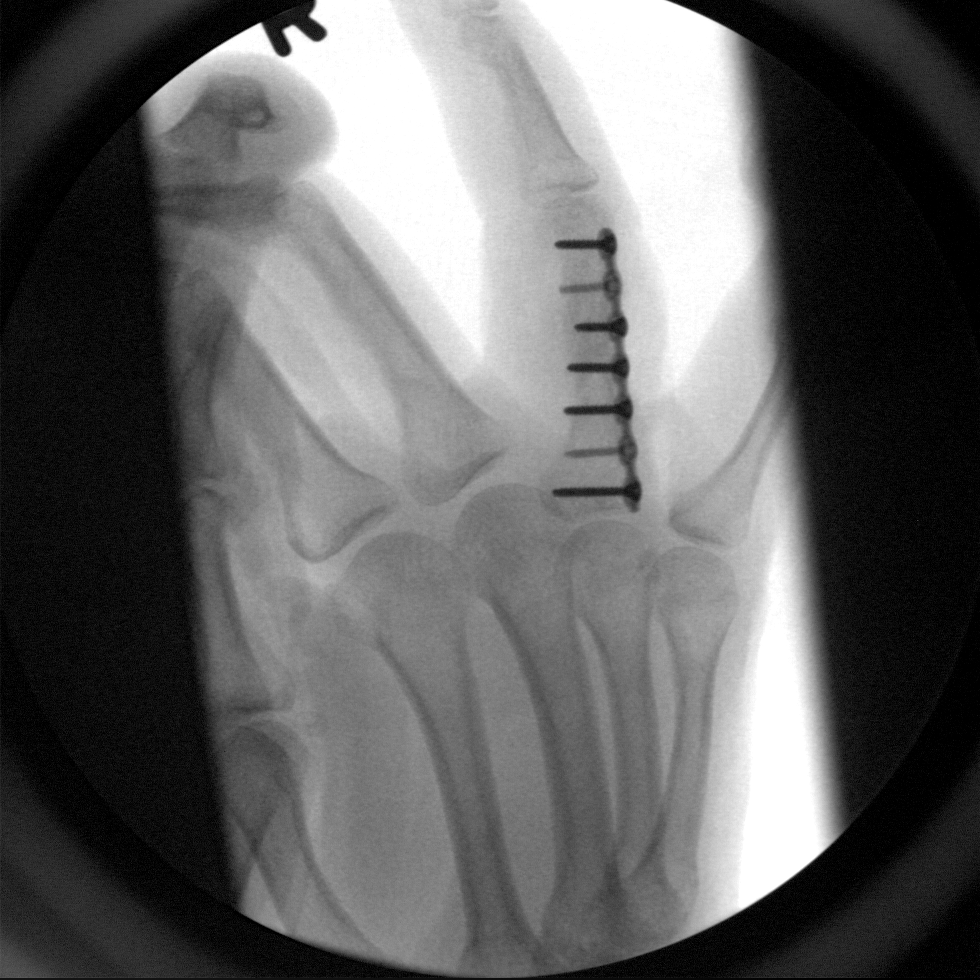
[im 3/3]
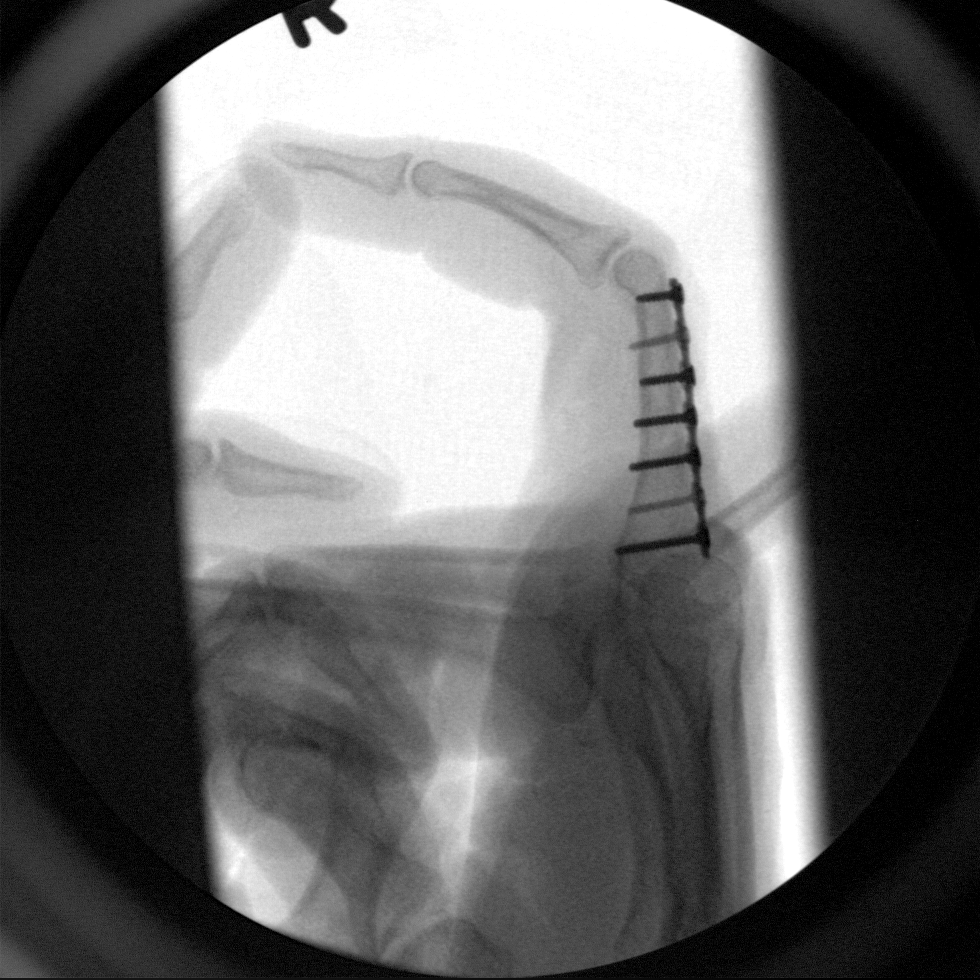

[3 of 3 positions shown; findings below may reference images not displayed]

FINDINGS: Three intraoperative spot fluoroscopic images centered over the
proximal right ring finger are provided. The ring finger proximal
phalanx fracture has been fixated with a dorsal plate and screws.
Fracture alignment appears anatomic on these limited fluoroscopic
images.
IMPRESSION: Intraoperative images following ORIF of ring finger proximal phalanx
fracture.

## 2016-10-19 DIAGNOSIS — F330 Major depressive disorder, recurrent, mild: Secondary | ICD-10-CM | POA: Insufficient documentation

## 2016-10-19 DIAGNOSIS — F411 Generalized anxiety disorder: Secondary | ICD-10-CM | POA: Insufficient documentation

## 2017-03-15 IMAGING — CR DG CHEST 2V
2 series · 2 of 2 positions shown · non-contrast
Comparison: 08/14/2013

CLINICAL DATA: Chest pain and pressure for several weeks. Shortness
of breath. Pitting edema. Abnormal EKG changes.

EXAM:
CHEST  2 VIEW

[w chest pa]
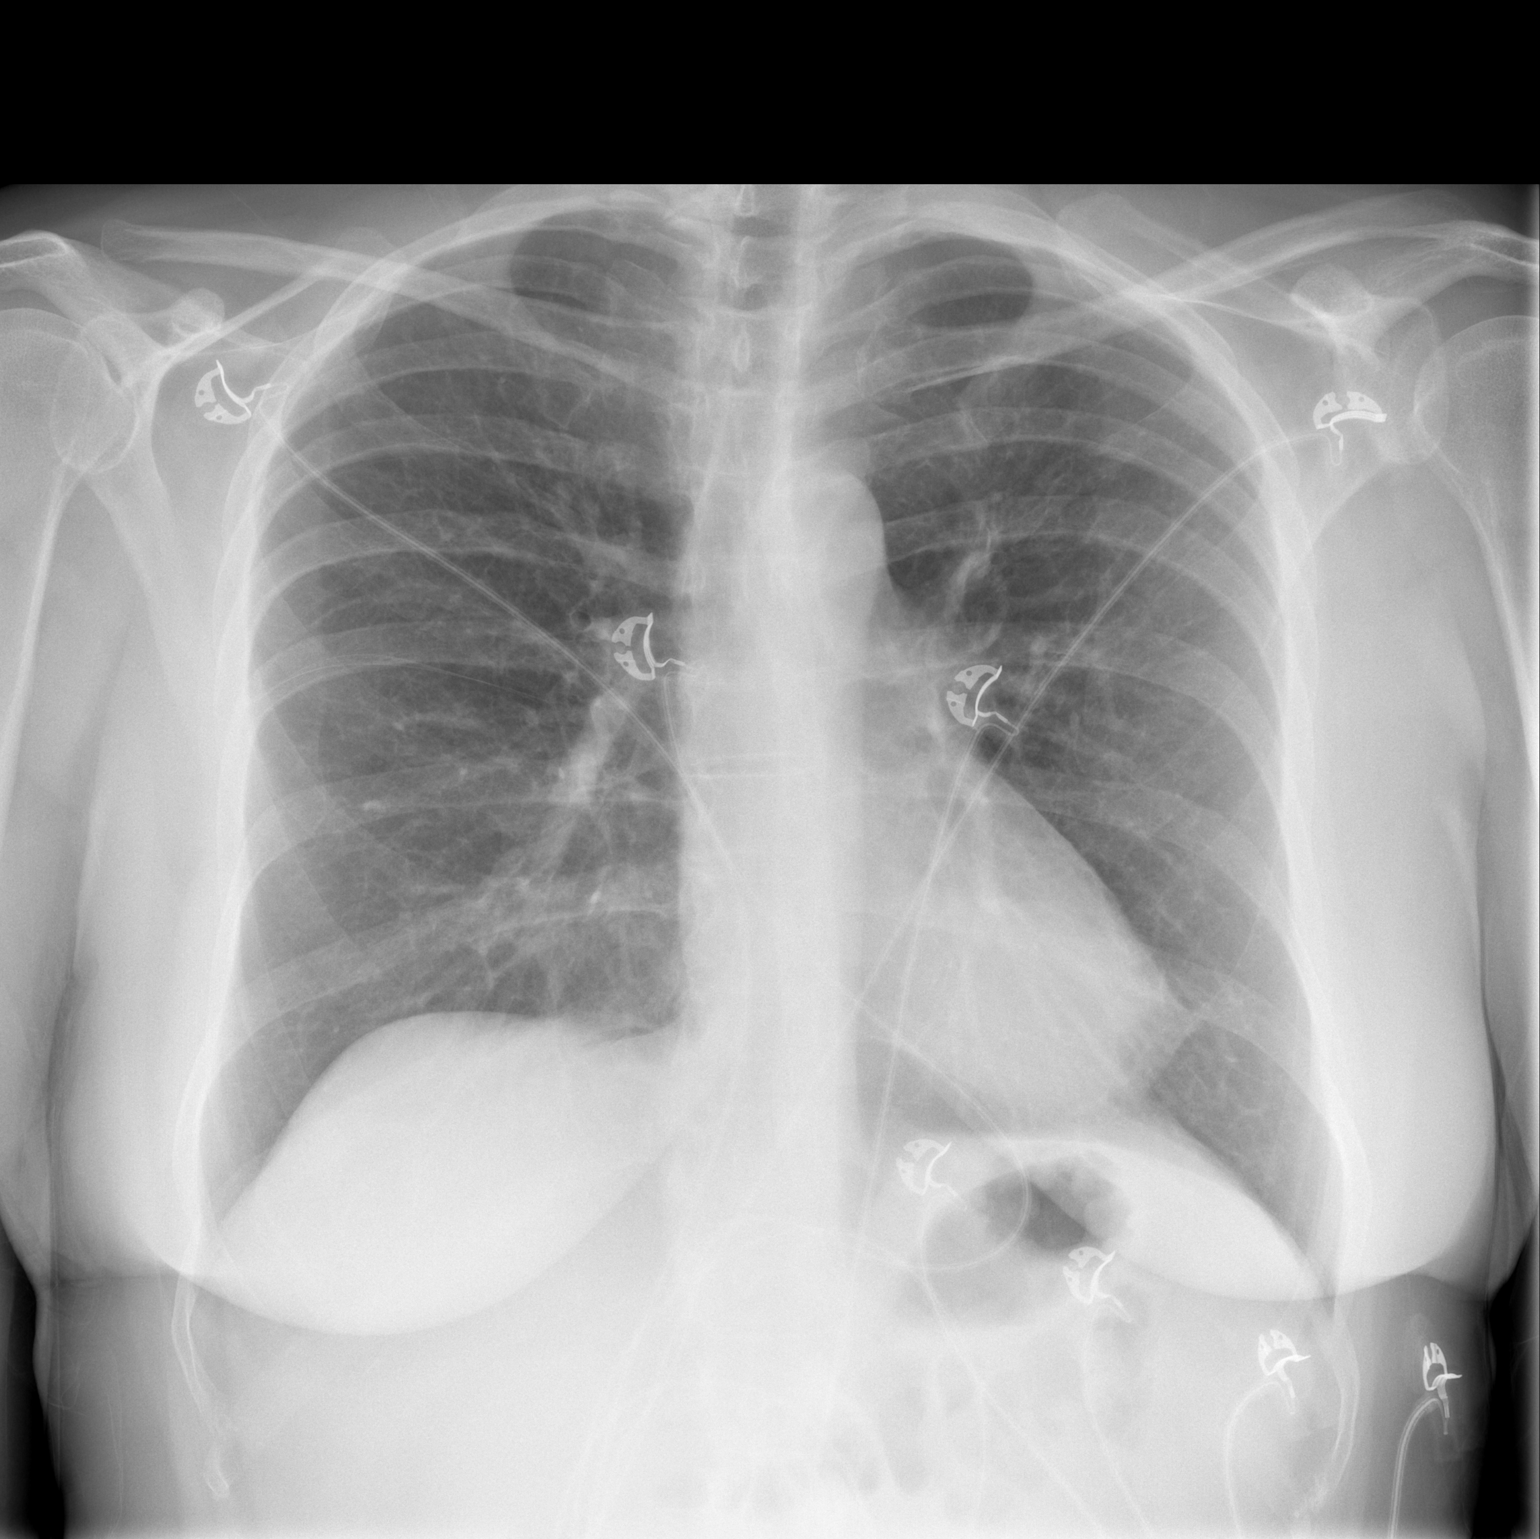

[w chest lat]
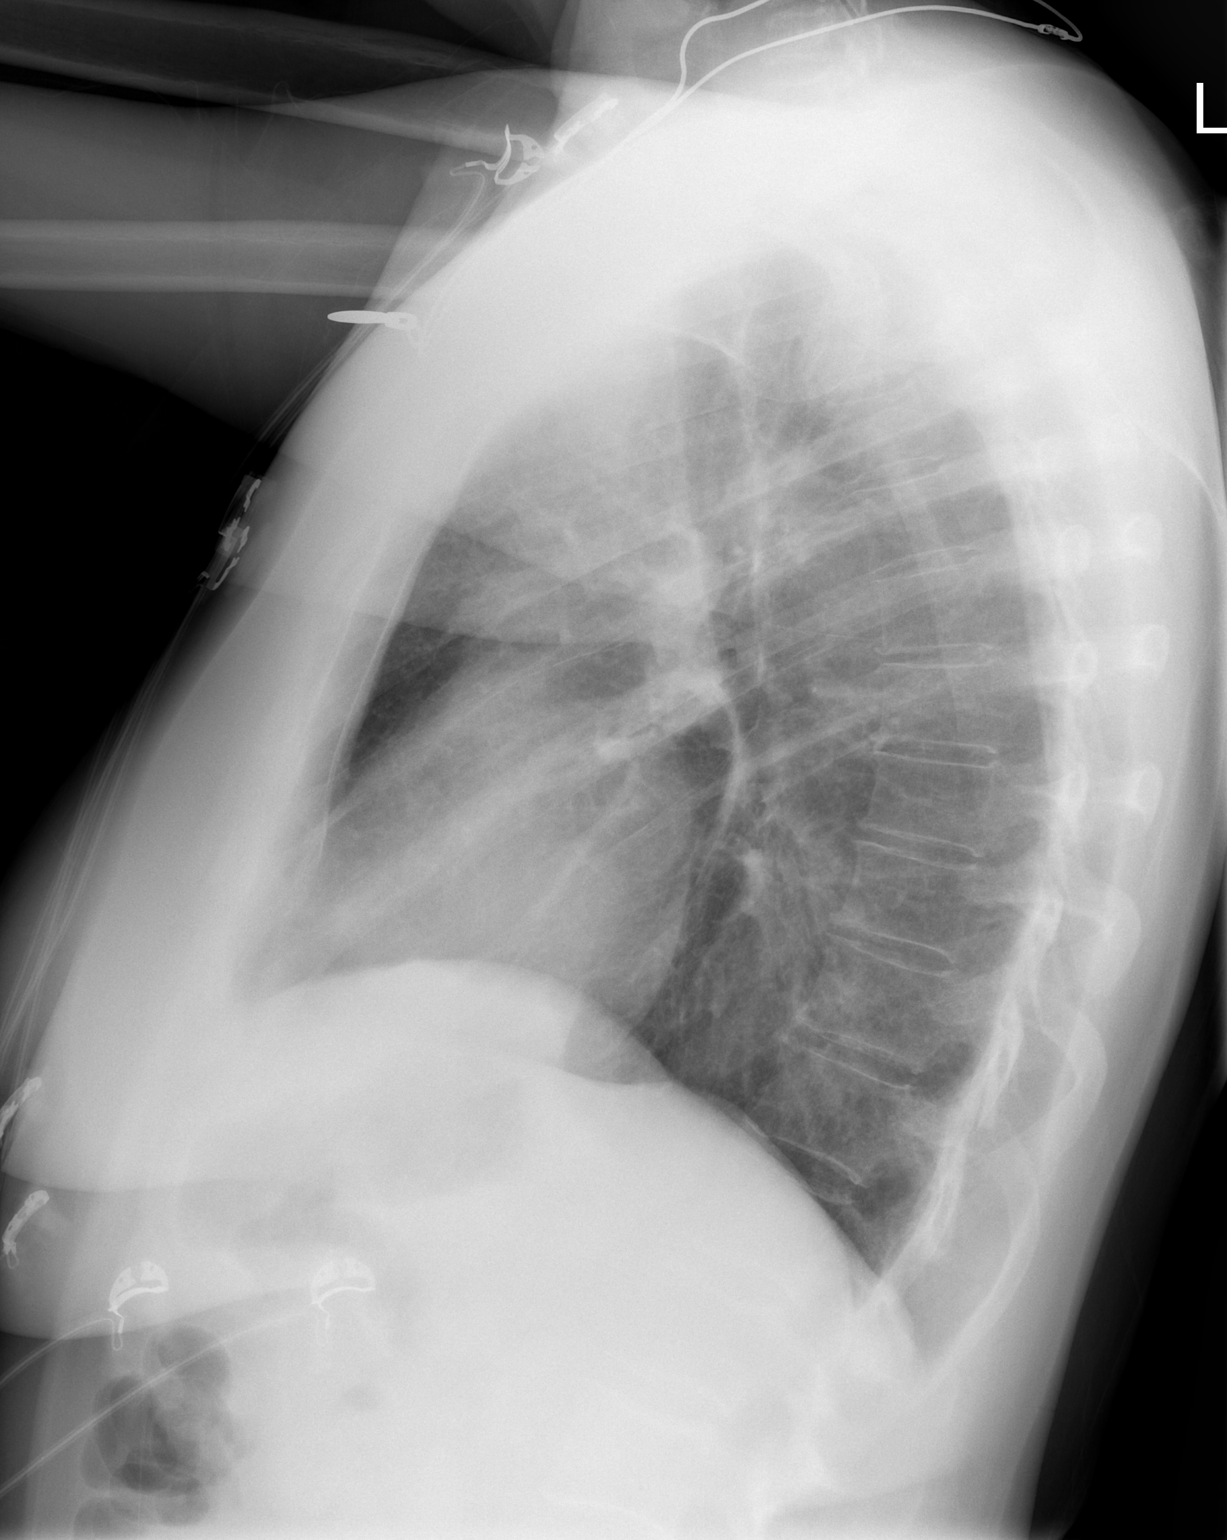

[2 of 2 positions shown; findings below may reference images not displayed]

FINDINGS: The heart size and mediastinal contours are within normal limits.
Both lungs are clear. The visualized skeletal structures are
unremarkable.
IMPRESSION: No active cardiopulmonary disease.

## 2017-09-11 ENCOUNTER — Encounter (HOSPITAL_COMMUNITY): Payer: Self-pay | Admitting: Psychiatry

## 2017-09-11 ENCOUNTER — Ambulatory Visit (INDEPENDENT_AMBULATORY_CARE_PROVIDER_SITE_OTHER): Admitting: Psychiatry

## 2017-09-11 VITALS — BP 108/74 | HR 64 | Ht 70.0 in | Wt 162.0 lb

## 2017-09-11 DIAGNOSIS — Z87891 Personal history of nicotine dependence: Secondary | ICD-10-CM

## 2017-09-11 DIAGNOSIS — Z813 Family history of other psychoactive substance abuse and dependence: Secondary | ICD-10-CM | POA: Diagnosis not present

## 2017-09-11 DIAGNOSIS — F3181 Bipolar II disorder: Secondary | ICD-10-CM | POA: Diagnosis not present

## 2017-09-11 DIAGNOSIS — G4700 Insomnia, unspecified: Secondary | ICD-10-CM

## 2017-09-11 DIAGNOSIS — R450 Nervousness: Secondary | ICD-10-CM

## 2017-09-11 DIAGNOSIS — F411 Generalized anxiety disorder: Secondary | ICD-10-CM | POA: Diagnosis not present

## 2017-09-11 MED ORDER — CARIPRAZINE HCL 3 MG PO CAPS: 3.0000 mg | ORAL_CAPSULE | Freq: Every day | ORAL | 0 refills | Status: DC

## 2017-09-11 MED ORDER — CLONAZEPAM 2 MG PO TABS: 4.0000 mg | ORAL_TABLET | Freq: Two times a day (BID) | ORAL | 1 refills | Status: DC
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
## 2017-09-13 IMAGING — CR DG CHEST 2V
2 series · 2 of 2 positions shown · non-contrast
Comparison: Chest radiograph performed 06/03/2015

CLINICAL DATA: Left anterior chest pain and shortness of breath.
Recent weight gain. Initial encounter.

EXAM:
CHEST  2 VIEW

[w chest pa]
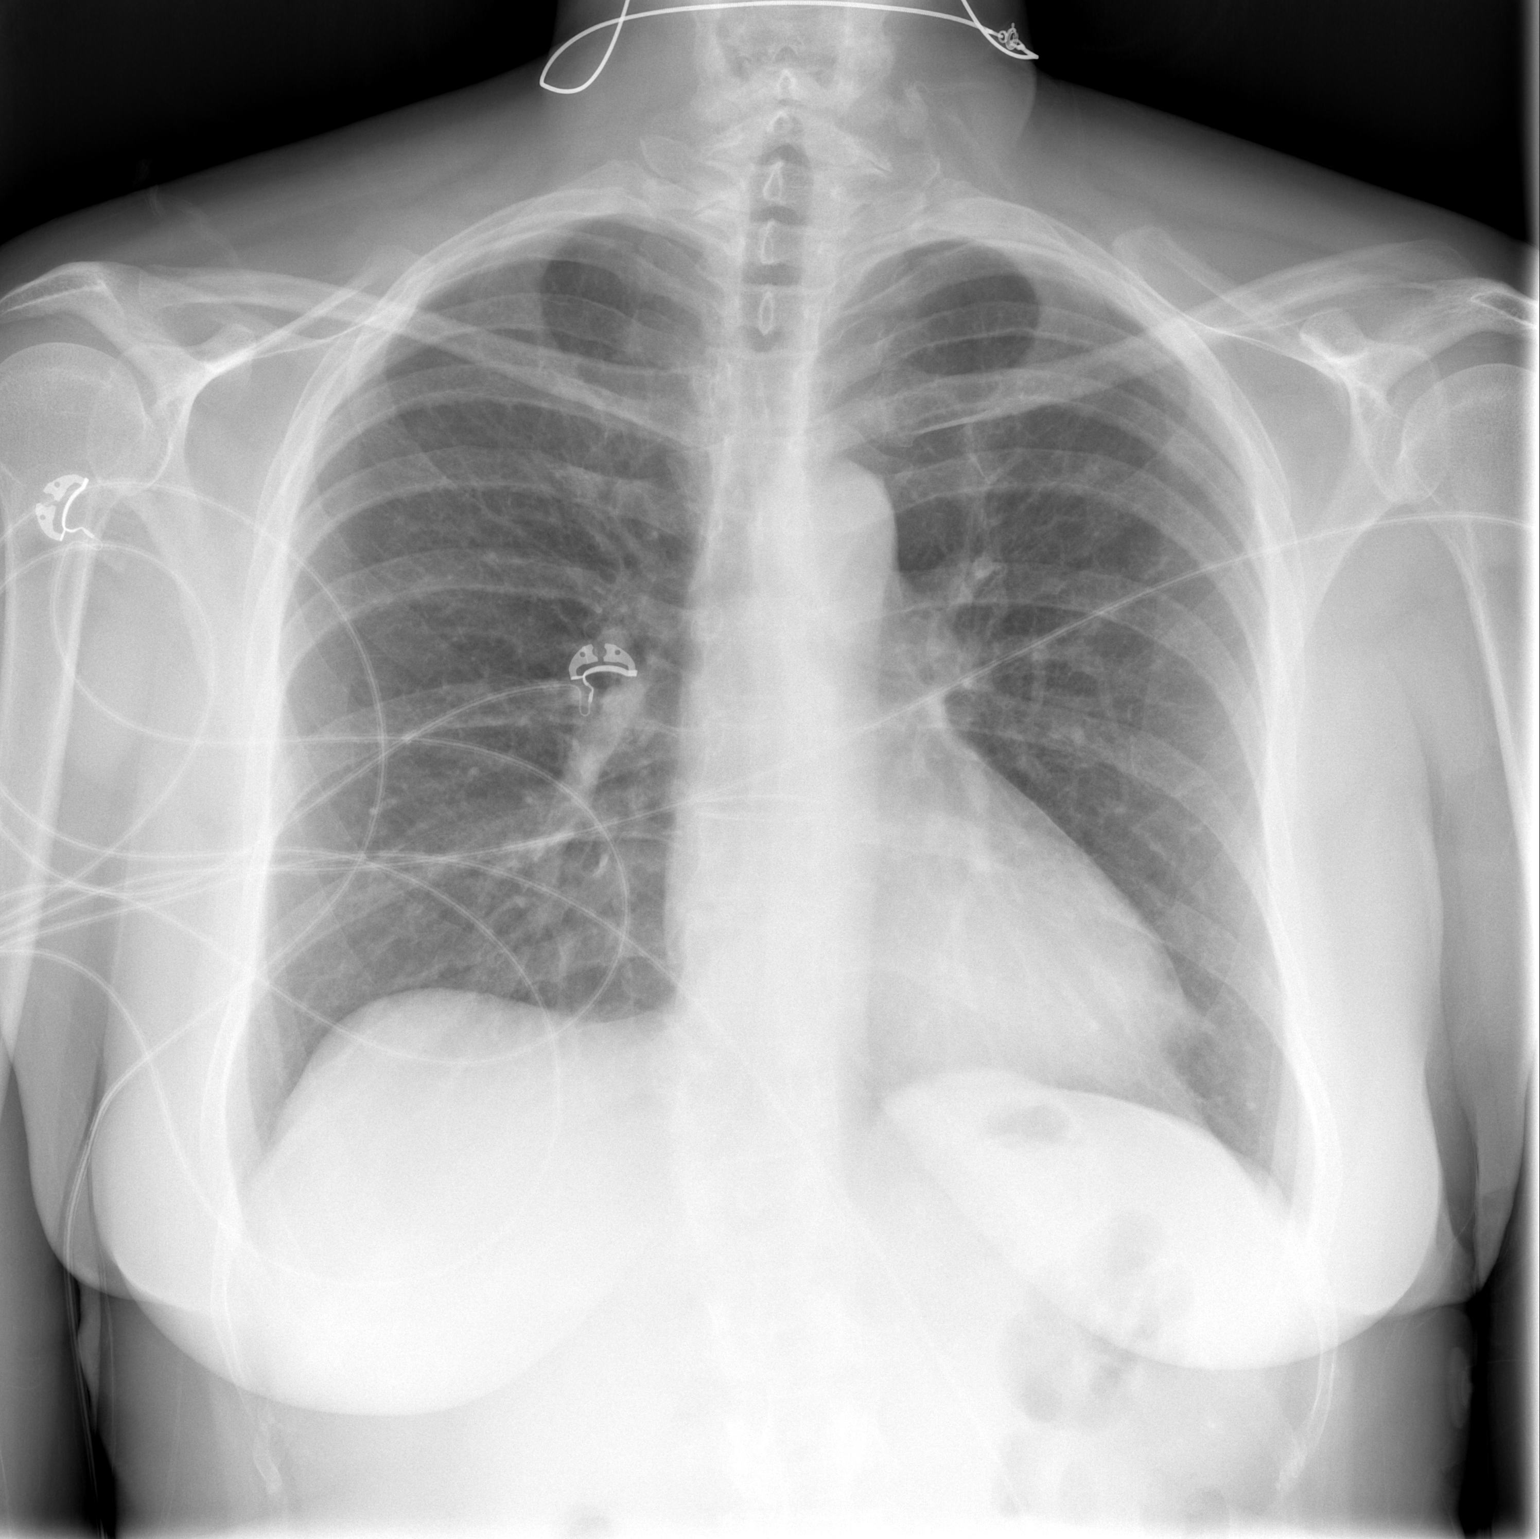

[w chest lat]
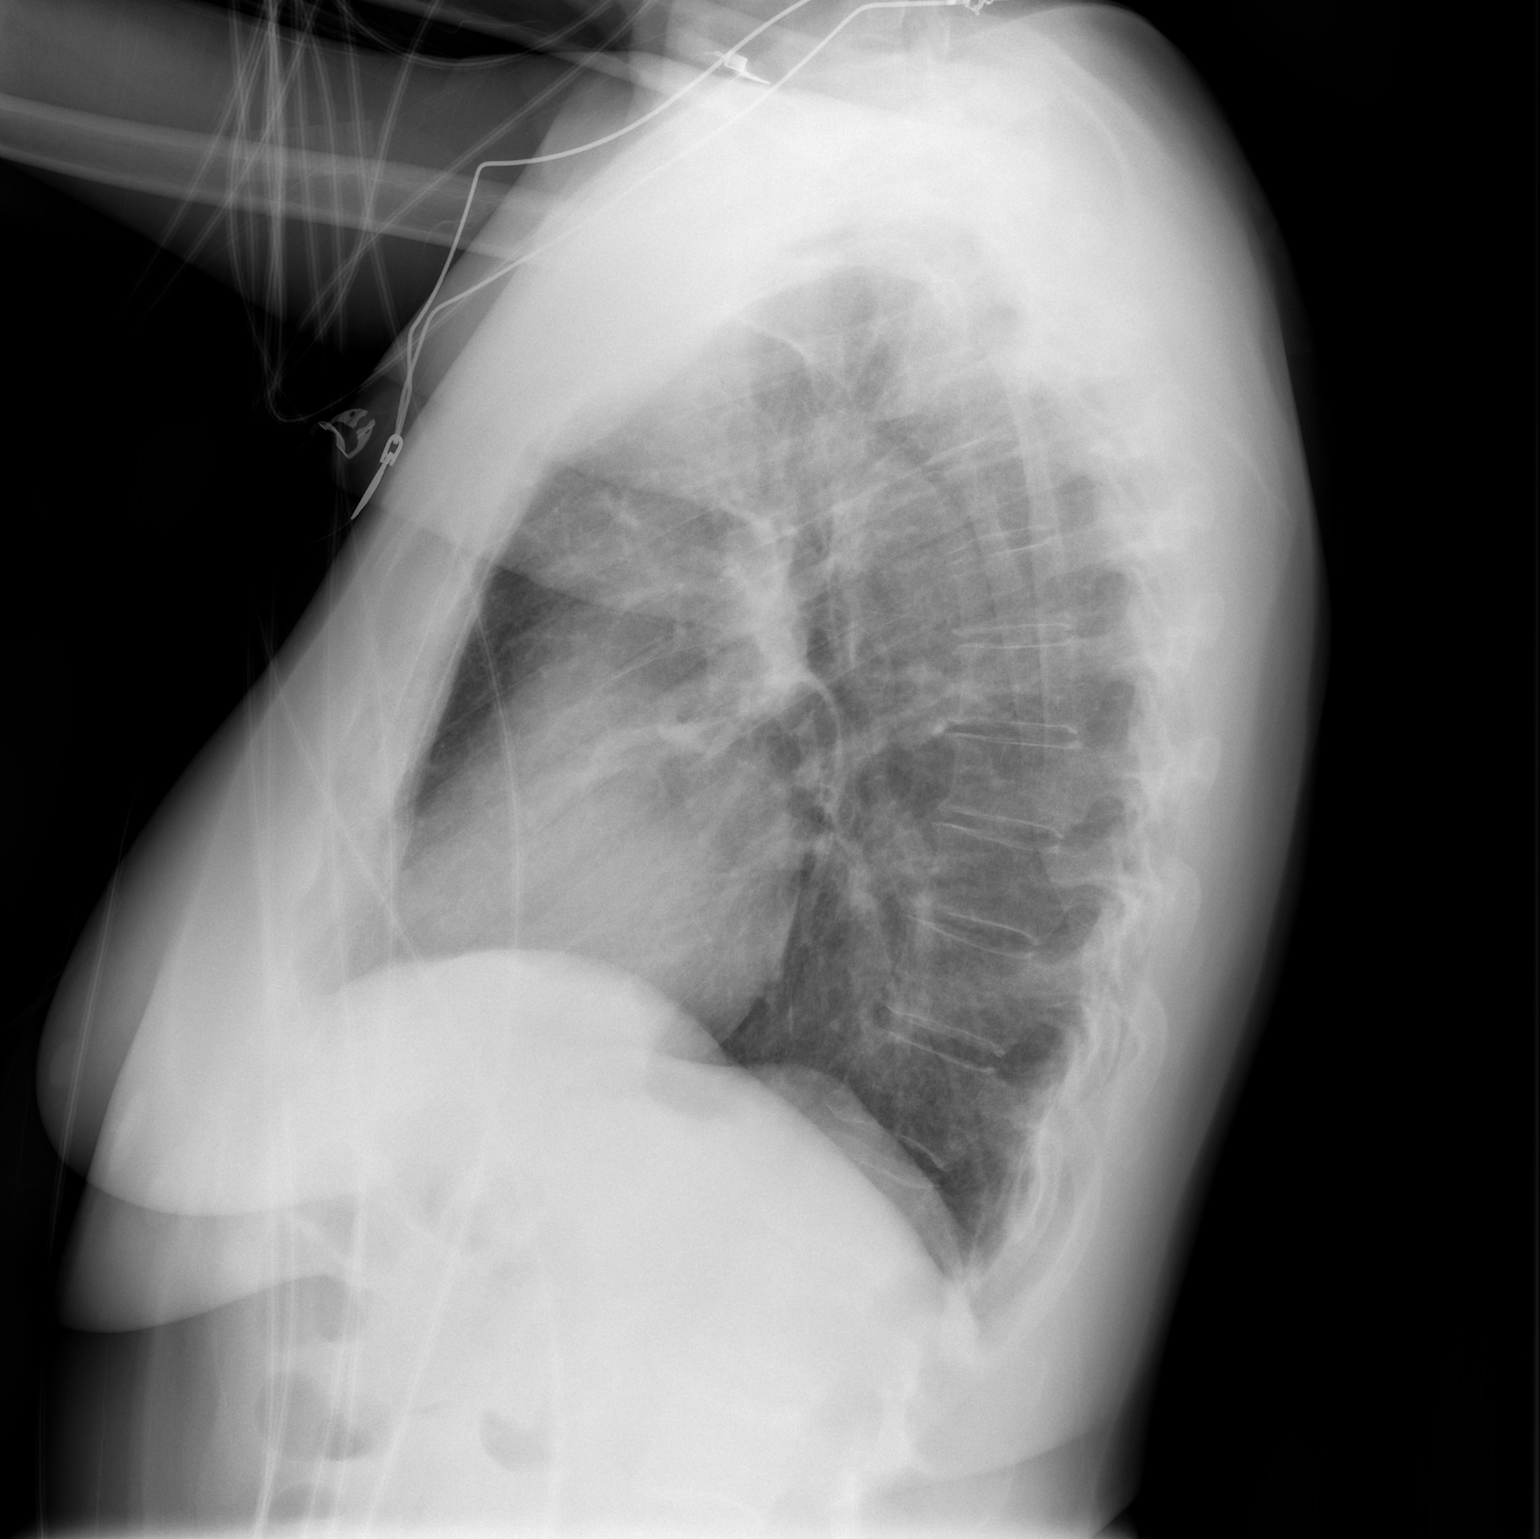

[2 of 2 positions shown; findings below may reference images not displayed]

FINDINGS: The lungs are well-aerated. Pulmonary vascularity is at the upper
limits of normal. There is no evidence of focal opacification,
pleural effusion or pneumothorax.

The heart is normal in size; the mediastinal contour is within
normal limits. No acute osseous abnormalities are seen.
IMPRESSION: No acute cardiopulmonary process seen.

## 2017-09-16 DIAGNOSIS — J302 Other seasonal allergic rhinitis: Secondary | ICD-10-CM | POA: Insufficient documentation

## 2017-09-16 DIAGNOSIS — F319 Bipolar disorder, unspecified: Secondary | ICD-10-CM | POA: Insufficient documentation

## 2017-09-16 DIAGNOSIS — J3089 Other allergic rhinitis: Secondary | ICD-10-CM

## 2017-09-27 DIAGNOSIS — R51 Headache: Secondary | ICD-10-CM

## 2017-09-27 DIAGNOSIS — R519 Headache, unspecified: Secondary | ICD-10-CM | POA: Insufficient documentation

## 2017-09-28 ENCOUNTER — Ambulatory Visit (INDEPENDENT_AMBULATORY_CARE_PROVIDER_SITE_OTHER): Admitting: Psychiatry

## 2017-09-28 ENCOUNTER — Encounter (HOSPITAL_COMMUNITY): Payer: Self-pay | Admitting: Psychiatry

## 2017-09-28 VITALS — BP 104/72 | HR 73 | Ht 70.0 in | Wt 164.8 lb

## 2017-09-28 DIAGNOSIS — F4312 Post-traumatic stress disorder, chronic: Secondary | ICD-10-CM | POA: Diagnosis not present

## 2017-09-28 DIAGNOSIS — F3181 Bipolar II disorder: Secondary | ICD-10-CM | POA: Diagnosis not present

## 2017-09-28 DIAGNOSIS — F411 Generalized anxiety disorder: Secondary | ICD-10-CM | POA: Diagnosis not present

## 2017-09-28 MED ORDER — MIRTAZAPINE 15 MG PO TABS: 15.0000 mg | ORAL_TABLET | Freq: Every day | ORAL | 0 refills | Status: DC

## 2017-09-28 MED ORDER — LAMOTRIGINE 100 MG PO TABS: ORAL_TABLET | ORAL | 0 refills | Status: DC

## 2017-09-28 MED ORDER — LAMOTRIGINE 25 MG PO TABS: ORAL_TABLET | ORAL | 0 refills | Status: DC

## 2017-09-28 NOTE — Patient Instructions (Signed)
Week 1 Lamictal 25 mg daily in morning  Week 2 Lamictal 50 mg (2 tablets) daily in morning  Week 3 Lamictal 100 mg (new tablet) in morning  Week 4 Lamictal 200 mg (2 tablets) in the morning   START Remeron 15 mg at dinner   Continue clonazepam as is

## 2017-09-28 NOTE — Progress Notes (Signed)
[facility] MD/PA/NP OP Progress Note  09/28/2017 9:31 AM Kaizley Aja  MRN:  [id number]  Chief Complaint: med management  HPI: Deanna Charles reports that vraylar was poorly tolerated and made her feel very anxious and restless, so she discontinued.  She has been taking the clonazepam twice a day and this does seem to help even out some of her anxiety.  She continues to have trouble sleeping, and continues to feel like her mood is up and down.  We spent time discussing Lamictal including the risks of Stevens-Johnson syndrome, and common intolerances including gastrointestinal.  We agreed to titrate Lamictal as below to goal dose of 200 mg daily.  We also agreed to initiate Remeron nightly and I cautioned her on some of the appetite increase is associated with Remeron.  She denies any intentions to harm herself or acute suicidal thoughts.  She and her son continue to clash and have struggles in getting along at home.  She is agreeable to follow-up in 2 weeks for her last visit with this Probation officer and to transition her care thereafter to new provider at this practice.  Visit Diagnosis:    ICD-10-CM   1. GAD (generalized anxiety disorder) F41.1 mirtazapine (REMERON) 15 MG tablet    lamoTRIgine (LAMICTAL) 25 MG tablet    lamoTRIgine (LAMICTAL) 100 MG tablet  2. Bipolar 2 disorder ([place]) F31.81   3. Chronic post-traumatic stress disorder (PTSD) F43.12     Past Psychiatric History: See intake H&P for full details. Reviewed, with no updates at this time.   Past Medical History:  Past Medical History:  Diagnosis Date  . Anxiety   . Depression   . PONV (postoperative nausea and vomiting)     Past Surgical History:  Procedure Laterality Date  . CESAREAN SECTION    . OPEN REDUCTION INTERNAL FIXATION (ORIF) PROXIMAL PHALANX Right 08/20/2014   Procedure: OPEN TREATMENT RIGHT RING FINGER FRACTURE;  Surgeon: Milly Jakob, MD;  Location: [facility];  Service: Orthopedics;  Laterality: Right;   . Surgery on both feet - removed bone    . Uterine ablation      Family Psychiatric History: See intake H&P for full details. Reviewed, with no updates at this time.   Family History:  Family History  Problem Relation Age of Onset  . Depression Mother   . Bipolar disorder Brother   . Drug abuse Brother   . Paranoid behavior Brother     Social History:  Social History   Socioeconomic History  . Marital status: Legally Separated    Spouse name: Not on file  . Number of children: 3  . Years of education: Not on file  . Highest education level: Some college, no degree  Occupational History  . Not on file  Social Needs  . Financial resource strain: Very hard  . Food insecurity:    Worry: Often true    Inability: Often true  . Transportation needs:    Medical: No    Non-medical: No  Tobacco Use  . Smoking status: Former Smoker    Types: Cigarettes    Last attempt to quit: 02/12/2011    Years since quitting: 6.6  . Smokeless tobacco: Never Used  . Tobacco comment: Quit 4 years ago   Substance and Sexual Activity  . Alcohol use: No  . Drug use: No  . Sexual activity: Not Currently  Lifestyle  . Physical activity:    Days per week: 7 days    Minutes per session: 20  min  . Stress: Very much  Relationships  . Social connections:    Talks on phone: Never    Gets together: Never    Attends religious service: Never    Active member of club or organization: No    Attends meetings of clubs or organizations: Never    Relationship status: Separated  Other Topics Concern  . Not on file  Social History Narrative  . Not on file    Allergies: No Known Allergies  Metabolic Disorder Labs: No results found for: HGBA1C, MPG No results found for: PROLACTIN No results found for: CHOL, TRIG, HDL, CHOLHDL, VLDL, LDLCALC Lab Results  Component Value Date   TSH 1.040 06/03/2015    Therapeutic Level Labs: No results found for: LITHIUM No results found for: VALPROATE No  components found for:  CBMZ  Current Medications: Current Outpatient Medications  Medication Sig Dispense Refill  . clonazePAM (KLONOPIN) 2 MG tablet Take 2 tablets (4 mg total) by mouth 2 (two) times daily. For anxiety 60 tablet 1  . estrogen, conjugated,-medroxyprogesterone (PREMPRO) 0.3-1.5 MG tablet Take 1 tablet by mouth daily.    . SUMAtriptan (IMITREX) 100 MG tablet Take 100 mg by mouth every 2 (two) hours as needed for migraine. May repeat in 2 hours if headache persists or recurs.    . verapamil (CALAN-SR) 120 MG CR tablet Take 120 mg by mouth at bedtime.    Derrill Memo ON 10/12/2017] lamoTRIgine (LAMICTAL) 100 MG tablet Take 1 tablet (100 mg total) by mouth daily for 7 days, THEN 2 tablets (200 mg total) daily. 187 tablet 0  . lamoTRIgine (LAMICTAL) 25 MG tablet Take 1 tablet (25 mg total) by mouth daily for 7 days, THEN 2 tablets (50 mg total) daily for 7 days. 21 tablet 0  . mirtazapine (REMERON) 15 MG tablet Take 1 tablet (15 mg total) by mouth at bedtime. 90 tablet 0   No current facility-administered medications for this visit.      Musculoskeletal: Strength & Muscle Tone: within normal limits Gait & Station: normal Patient leans: N/A  Psychiatric Specialty Exam: ROS  Blood pressure 104/72, pulse 73, height _0  (1.778 m), weight 164 lb 12.8 oz (74.8 kg).Body mass index is 23.65 kg/m.  General Appearance: Casual and Well Groomed  Eye Contact:  Good  Speech:  Clear and Coherent and Normal Rate  Volume:  Normal  Mood:  Dysphoric  Affect:  Congruent  Thought Process:  Goal Directed and Descriptions of Associations: Intact  Orientation:  Full (Time, Place, and Person)  Thought Content: Logical   Suicidal Thoughts:  No  Homicidal Thoughts:  No  Memory:  Immediate;   Fair  Judgement:  Fair  Insight:  Fair  Psychomotor Activity:  Normal  Concentration:  Concentration: Fair  Recall:  [employer] of Knowledge: Fair  Language: Fair  Akathisia:  Negative  Handed:  Right   AIMS (if indicated): not done  Assets:  Communication Skills Desire for Improvement Financial Resources/Insurance Housing  ADL's:  Intact  Cognition: WNL  Sleep:  Good   Screenings:   Assessment and Plan: Rhett Najera continues to struggle with anxiety, mood lability, difficulty sleeping, depressed mood.  We attempted vraylar but she had what sounds like akathisia, and felt better upon discontinuing the medication.  She continues to have trouble sleeping, and up and down mood.  She has been tried on a myriad of typical antidepressants without any significant benefit.  We agreed to proceed with an antiepileptic mood stabilizer  as below.  We also agreed to initiate Remeron nightly for sleep.  She will follow-up in 2 weeks or sooner if needed.  1. GAD (generalized anxiety disorder)   2. Bipolar 2 disorder ([place])   3. Chronic post-traumatic stress disorder (PTSD)     Status of current problems: unchanged  Labs Ordered: No orders of the defined types were placed in this encounter.   Labs Reviewed: n/a  Collateral Obtained/Records Reviewed: nccsd  Plan:  Continue clonazepam 2 mg twice daily Initiate Lamictal 25 mg x 1 week, then 50 mg x 1 week, then 100 mg x 1 week, then 200 mg daily Cautioned patient regarding side effects of Lamictal including the risk of SJS Initiate Remeron 15 mg nightly Follow-up in 2 weeks  Aundra Dubin, MD 09/28/2017, 9:31 AM

## 2017-10-06 ENCOUNTER — Other Ambulatory Visit (HOSPITAL_COMMUNITY): Payer: Self-pay | Admitting: Psychiatry

## 2017-10-06 DIAGNOSIS — F411 Generalized anxiety disorder: Secondary | ICD-10-CM

## 2017-10-10 ENCOUNTER — Encounter (HOSPITAL_COMMUNITY): Payer: Self-pay | Admitting: Psychiatry

## 2017-10-10 ENCOUNTER — Ambulatory Visit (INDEPENDENT_AMBULATORY_CARE_PROVIDER_SITE_OTHER): Admitting: Psychiatry

## 2017-10-10 DIAGNOSIS — Z818 Family history of other mental and behavioral disorders: Secondary | ICD-10-CM

## 2017-10-10 DIAGNOSIS — F3181 Bipolar II disorder: Secondary | ICD-10-CM | POA: Diagnosis not present

## 2017-10-10 DIAGNOSIS — F411 Generalized anxiety disorder: Secondary | ICD-10-CM | POA: Diagnosis not present

## 2017-10-10 MED ORDER — MIRTAZAPINE 30 MG PO TABS: 30.0000 mg | ORAL_TABLET | Freq: Every day | ORAL | 0 refills | Status: DC

## 2017-10-10 NOTE — Progress Notes (Signed)
[facility] MD/PA/NP OP Progress Note  10/10/2017 2:45 PM Deanna Charles  MRN:  [id number]  Chief Complaint: med management  HPI: Deanna Charles reports the Lamictal seems to be helping.  She had had a few days where she had some muscle aches and cramps in her legs and stiff neck but this went away.  Educated her on the risk of acellular meningitis, reiterated the risk of Stevens-Johnson syndrome and suggested if the symptoms return to stop the medicine immediately and call the clinic.  She is currently at the 50 mg dose and is going to stay on this for another few weeks before she increases to 100 mg.  She will follow-up with Dr. Donell Beers in office here and have a visit with Dr. Jama Flavors in the interim.  Patient is aware that writer's last day at this clinic is tomorrow and she understands resources available if she has any urgent concerns.  We agreed to increase Remeron as well to help with her depression and sleep.  She feels hopeful that the Lamictal and Remeron combination seems to be providing her some benefit.  Visit Diagnosis:    ICD-10-CM   1. GAD (generalized anxiety disorder) F41.1 mirtazapine (REMERON) 30 MG tablet  2. Bipolar 2 disorder ([facility]) F31.81     Past Psychiatric History: See intake H&P for full details. Reviewed, with no updates at this time.   Past Medical History:  Past Medical History:  Diagnosis Date  . Anxiety   . Depression   . PONV (postoperative nausea and vomiting)     Past Surgical History:  Procedure Laterality Date  . CESAREAN SECTION    . OPEN REDUCTION INTERNAL FIXATION (ORIF) PROXIMAL PHALANX Right 08/20/2014   Procedure: OPEN TREATMENT RIGHT RING FINGER FRACTURE;  Surgeon: Mack Hook, MD;  Location: [facility];  Service: Orthopedics;  Laterality: Right;  . Surgery on both feet - removed bone    . Uterine ablation      Family Psychiatric History: See intake H&P for full details. Reviewed, with no updates at this time.   Family History:   Family History  Problem Relation Age of Onset  . Depression Mother   . Bipolar disorder Brother   . Drug abuse Brother   . Paranoid behavior Brother     Social History:  Social History   Socioeconomic History  . Marital status: Legally Separated    Spouse name: Not on file  . Number of children: 3  . Years of education: Not on file  . Highest education level: Some college, no degree  Occupational History  . Not on file  Social Needs  . Financial resource strain: Very hard  . Food insecurity:    Worry: Often true    Inability: Often true  . Transportation needs:    Medical: No    Non-medical: No  Tobacco Use  . Smoking status: Former Smoker    Types: Cigarettes    Last attempt to quit: 02/12/2011    Years since quitting: 6.6  . Smokeless tobacco: Never Used  . Tobacco comment: Quit 4 years ago   Substance and Sexual Activity  . Alcohol use: No  . Drug use: No  . Sexual activity: Not Currently  Lifestyle  . Physical activity:    Days per week: 7 days    Minutes per session: 20 min  . Stress: Very much  Relationships  . Social connections:    Talks on phone: Never    Gets together: Never  Attends religious service: Never    Active member of club or organization: No    Attends meetings of clubs or organizations: Never    Relationship status: Separated  Other Topics Concern  . Not on file  Social History Narrative  . Not on file    Allergies: No Known Allergies  Metabolic Disorder Labs: No results found for: HGBA1C, MPG No results found for: PROLACTIN No results found for: CHOL, TRIG, HDL, CHOLHDL, VLDL, LDLCALC Lab Results  Component Value Date   TSH 1.040 06/03/2015    Therapeutic Level Labs: No results found for: LITHIUM No results found for: VALPROATE No components found for:  CBMZ  Current Medications: Current Outpatient Medications  Medication Sig Dispense Refill  . clonazePAM (KLONOPIN) 2 MG tablet Take 2 tablets (4 mg total) by mouth  2 (two) times daily. For anxiety 60 tablet 1  . estrogen, conjugated,-medroxyprogesterone (PREMPRO) 0.3-1.5 MG tablet Take 1 tablet by mouth daily.    Melene Muller ON 10/12/2017] lamoTRIgine (LAMICTAL) 100 MG tablet Take 1 tablet (100 mg total) by mouth daily for 7 days, THEN 2 tablets (200 mg total) daily. 187 tablet 0  . lamoTRIgine (LAMICTAL) 25 MG tablet Take 1 tablet (25 mg total) by mouth daily for 7 days, THEN 2 tablets (50 mg total) daily for 7 days. 21 tablet 0  . mirtazapine (REMERON) 30 MG tablet Take 1 tablet (30 mg total) by mouth at bedtime. 90 tablet 0  . SUMAtriptan (IMITREX) 100 MG tablet Take 100 mg by mouth every 2 (two) hours as needed for migraine. May repeat in 2 hours if headache persists or recurs.    . verapamil (CALAN-SR) 120 MG CR tablet Take 120 mg by mouth at bedtime.     No current facility-administered medications for this visit.     Musculoskeletal: Strength & Muscle Tone: within normal limits Gait & Station: normal Patient leans: N/A  Psychiatric Specialty Exam: ROS  There were no vitals taken for this visit.There is no height or weight on file to calculate BMI.  General Appearance: Casual and Well Groomed  Eye Contact:  Good  Speech:  Clear and Coherent and Normal Rate  Volume:  Normal  Mood:  Dysphoric  Affect:  Congruent  Thought Process:  Goal Directed and Descriptions of Associations: Intact  Orientation:  Full (Time, Place, and Person)  Thought Content: Logical   Suicidal Thoughts:  No  Homicidal Thoughts:  No  Memory:  Immediate;   Fair  Judgement:  Fair  Insight:  Fair  Psychomotor Activity:  Normal  Concentration:  Concentration: Fair  Recall:  [employer] of Knowledge: Fair  Language: Fair  Akathisia:  Negative  Handed:  Right  AIMS (if indicated): not done  Assets:  Communication Skills Desire for Improvement Financial Resources/Insurance Housing  ADL's:  Intact  Cognition: WNL  Sleep:  Good   Screenings:   Assessment and Plan:  Deanna Charles reports that she is experiencing some benefit with the initiation of Lamictal.  She had some muscle cramping but this went away after a few days.  We agreed to continue the dose of 50 mg for a couple more weeks before she increases to 100 mg.  I reiterated the risks of SJS and acellular meningitis and encouraged her to call the clinic if she has any acute concerns.  She will be continuing in her care with this office and has coordinated for a transfer of care to Dr. Donell Beers.  1. GAD (generalized anxiety disorder)  2. Bipolar 2 disorder ([facility])     Status of current problems: unchanged  Labs Ordered: No orders of the defined types were placed in this encounter.   Labs Reviewed: n/a  Collateral Obtained/Records Reviewed: nccsd  Plan:  Continue clonazepam 2 mg twice daily Lamictal 50 mg daily, increase to 100 mg in 1-2 weeks, then 200 mg after another month pending any side effects, intolerance Cautioned patient regarding side effects of Lamictal including the risk of SJS and acellular meningitis Increase Remeron to 30 mg nightly Follow-up in 6 weeks  Burnard LeighAlexander Arya Meigan Pates, MD 10/10/2017, 2:45 PM

## 2017-11-05 ENCOUNTER — Ambulatory Visit (INDEPENDENT_AMBULATORY_CARE_PROVIDER_SITE_OTHER): Admitting: Psychiatry

## 2017-11-05 ENCOUNTER — Encounter (INDEPENDENT_AMBULATORY_CARE_PROVIDER_SITE_OTHER): Payer: Self-pay

## 2017-11-05 ENCOUNTER — Encounter (HOSPITAL_COMMUNITY): Payer: Self-pay | Admitting: Psychiatry

## 2017-11-05 VITALS — BP 122/74 | Ht 70.0 in | Wt 170.0 lb

## 2017-11-05 DIAGNOSIS — Z818 Family history of other mental and behavioral disorders: Secondary | ICD-10-CM | POA: Diagnosis not present

## 2017-11-05 DIAGNOSIS — F411 Generalized anxiety disorder: Secondary | ICD-10-CM

## 2017-11-05 DIAGNOSIS — Z813 Family history of other psychoactive substance abuse and dependence: Secondary | ICD-10-CM

## 2017-11-05 DIAGNOSIS — Z79899 Other long term (current) drug therapy: Secondary | ICD-10-CM

## 2017-11-05 DIAGNOSIS — F3181 Bipolar II disorder: Secondary | ICD-10-CM | POA: Diagnosis not present

## 2017-11-05 MED ORDER — CLONAZEPAM 1 MG PO TABS: ORAL_TABLET | ORAL | 0 refills | Status: DC

## 2017-11-05 MED ORDER — SERTRALINE HCL 50 MG PO TABS: 50.0000 mg | ORAL_TABLET | Freq: Every day | ORAL | 0 refills | Status: DC
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
## 2017-11-06 LAB — TSH: TSH: 1.94 u[IU]/mL (ref 0.450–4.500)

## 2017-11-28 ENCOUNTER — Telehealth (HOSPITAL_COMMUNITY): Payer: Self-pay

## 2017-11-28 NOTE — Telephone Encounter (Signed)
Medication management - Telephone call with patient to follow up on message she left her anxiety was worse since starting Zoloft 50 mg, one a day and cutting back on Clonazepam to 1mg , twice a day per her choice.  Patient stated she stayed on Zoloft 50 mg for 3 weeks and then one week ago she cut that back to 25 mg, 1/2 tablet and was doing some better but still anxious at times.  Discussed with Dr. Jama Flavors as patient instructed to go back on Clonazepam 1 mg, one in the morning and 2 at bedtime as may have cut back too quickly.  Patient denied any suicidal or homicidal ideations, no plan or intent and reports mood is better but just anxious at times.  Agrees with plan to go back to the 3 Clonazepam a day and to cal back in the coming week on 12/02/17 if not improved to then be worked in to see Dr. Jama Flavors on Tuesday 12/03/17 if needed.  Patient reported understanding plan and agreed if any worsening of symptoms will call back or visit local ED if emergent need arises.  Patient stated no emergency at this time and will call back if anxiety does not improve with medication adjustments today.

## 2017-12-10 ENCOUNTER — Other Ambulatory Visit (HOSPITAL_COMMUNITY): Payer: Self-pay | Admitting: Psychiatry

## 2017-12-10 DIAGNOSIS — F411 Generalized anxiety disorder: Secondary | ICD-10-CM

## 2017-12-10 DIAGNOSIS — F3181 Bipolar II disorder: Secondary | ICD-10-CM

## 2017-12-13 ENCOUNTER — Ambulatory Visit (HOSPITAL_COMMUNITY): Payer: Self-pay | Admitting: Psychiatry

## 2017-12-16 ENCOUNTER — Other Ambulatory Visit (HOSPITAL_COMMUNITY): Payer: Self-pay

## 2017-12-16 DIAGNOSIS — F411 Generalized anxiety disorder: Secondary | ICD-10-CM

## 2017-12-16 DIAGNOSIS — F3181 Bipolar II disorder: Secondary | ICD-10-CM

## 2017-12-17 ENCOUNTER — Ambulatory Visit (INDEPENDENT_AMBULATORY_CARE_PROVIDER_SITE_OTHER): Admitting: Psychiatry

## 2017-12-17 ENCOUNTER — Encounter (HOSPITAL_COMMUNITY): Payer: Self-pay | Admitting: Psychiatry

## 2017-12-17 DIAGNOSIS — F3181 Bipolar II disorder: Secondary | ICD-10-CM | POA: Diagnosis not present

## 2017-12-17 DIAGNOSIS — F411 Generalized anxiety disorder: Secondary | ICD-10-CM | POA: Diagnosis not present

## 2017-12-17 MED ORDER — CLONAZEPAM 1 MG PO TABS: ORAL_TABLET | ORAL | 0 refills | Status: DC

## 2017-12-17 MED ORDER — ARIPIPRAZOLE 2 MG PO TABS: 2.0000 mg | ORAL_TABLET | Freq: Every day | ORAL | 0 refills | Status: DC

## 2017-12-17 MED ORDER — ESCITALOPRAM OXALATE 10 MG PO TABS: 5.0000 mg | ORAL_TABLET | Freq: Every day | ORAL | 0 refills | Status: DC
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
## 2018-01-20 ENCOUNTER — Other Ambulatory Visit (HOSPITAL_COMMUNITY): Payer: Self-pay | Admitting: Psychiatry

## 2018-01-21 ENCOUNTER — Other Ambulatory Visit (HOSPITAL_COMMUNITY): Payer: Self-pay

## 2018-01-21 DIAGNOSIS — F411 Generalized anxiety disorder: Secondary | ICD-10-CM

## 2018-01-21 DIAGNOSIS — F3181 Bipolar II disorder: Secondary | ICD-10-CM

## 2018-01-21 MED ORDER — CLONAZEPAM 1 MG PO TABS: ORAL_TABLET | ORAL | 0 refills | Status: DC

## 2018-01-21 MED ORDER — ESCITALOPRAM OXALATE 10 MG PO TABS: 5.0000 mg | ORAL_TABLET | Freq: Every day | ORAL | 0 refills | Status: DC

## 2018-01-21 MED ORDER — ARIPIPRAZOLE 2 MG PO TABS: 2.0000 mg | ORAL_TABLET | Freq: Every day | ORAL | 0 refills | Status: DC

## 2018-01-28 ENCOUNTER — Ambulatory Visit (HOSPITAL_COMMUNITY): Admitting: Psychiatry

## 2018-02-07 ENCOUNTER — Telehealth (HOSPITAL_COMMUNITY): Payer: Self-pay

## 2018-02-07 NOTE — Telephone Encounter (Signed)
Patient called and states that she does not like the Abilify - she would like to go up on the Lexapro and d/c the Abilify. She does not follow up until Feb.

## 2018-02-08 NOTE — Telephone Encounter (Signed)
Above acknowledged . If she is having side effects on Abilify she may stop it. See if you can fit her in on 02/17/18 .  Thanks!

## 2018-02-10 NOTE — Telephone Encounter (Signed)
Called patient to let her know, front desk is rescheduling appointment

## 2018-02-17 ENCOUNTER — Ambulatory Visit (HOSPITAL_COMMUNITY): Admitting: Psychiatry

## 2018-02-25 ENCOUNTER — Other Ambulatory Visit (HOSPITAL_COMMUNITY): Payer: Self-pay

## 2018-02-25 ENCOUNTER — Telehealth (HOSPITAL_COMMUNITY): Payer: Self-pay

## 2018-02-25 DIAGNOSIS — F411 Generalized anxiety disorder: Secondary | ICD-10-CM

## 2018-02-25 DIAGNOSIS — F3181 Bipolar II disorder: Secondary | ICD-10-CM

## 2018-02-25 NOTE — Telephone Encounter (Signed)
Patient needs refills on all of her medications, please review and advise, thank you

## 2018-03-11 ENCOUNTER — Ambulatory Visit (HOSPITAL_COMMUNITY): Admitting: Psychiatry

## 2018-03-11 ENCOUNTER — Other Ambulatory Visit (HOSPITAL_COMMUNITY): Payer: Self-pay

## 2018-03-11 DIAGNOSIS — F3181 Bipolar II disorder: Secondary | ICD-10-CM

## 2018-03-11 DIAGNOSIS — F411 Generalized anxiety disorder: Secondary | ICD-10-CM

## 2018-03-11 MED ORDER — CLONAZEPAM 1 MG PO TABS: ORAL_TABLET | ORAL | 0 refills | Status: DC

## 2018-03-11 MED ORDER — ESCITALOPRAM OXALATE 10 MG PO TABS: 5.0000 mg | ORAL_TABLET | Freq: Every day | ORAL | 0 refills | Status: DC

## 2018-03-11 MED ORDER — ARIPIPRAZOLE 2 MG PO TABS: 2.0000 mg | ORAL_TABLET | Freq: Every day | ORAL | 0 refills | Status: DC

## 2018-03-13 ENCOUNTER — Other Ambulatory Visit (HOSPITAL_COMMUNITY): Payer: Self-pay

## 2018-03-13 DIAGNOSIS — F3181 Bipolar II disorder: Secondary | ICD-10-CM

## 2018-03-13 DIAGNOSIS — F411 Generalized anxiety disorder: Secondary | ICD-10-CM

## 2018-03-13 MED ORDER — CLONAZEPAM 1 MG PO TABS: ORAL_TABLET | ORAL | 0 refills | Status: AC

## 2018-03-13 MED ORDER — ESCITALOPRAM OXALATE 10 MG PO TABS: 5.0000 mg | ORAL_TABLET | Freq: Every day | ORAL | 0 refills | Status: AC

## 2018-03-13 MED ORDER — ARIPIPRAZOLE 2 MG PO TABS: 2.0000 mg | ORAL_TABLET | Freq: Every day | ORAL | 0 refills | Status: AC

## 2018-03-17 ENCOUNTER — Ambulatory Visit (HOSPITAL_COMMUNITY): Admitting: Psychiatry

## 2018-03-18 ENCOUNTER — Ambulatory Visit (HOSPITAL_COMMUNITY): Admitting: Psychiatry
